# Patient Record
Sex: Female | Born: 1950 | Race: White | Hispanic: No | Smoking: Former smoker
Health system: Southern US, Community
[De-identification: ages and names within clinical notes are randomized; demographics above are authoritative.]

## PROBLEM LIST (undated history)

## (undated) HISTORY — PX: REDUCTION MAMMAPLASTY: SUR839

## (undated) HISTORY — PX: AUGMENTATION MAMMAPLASTY: SUR837

---

## 1998-08-09 ENCOUNTER — Ambulatory Visit (HOSPITAL_COMMUNITY): Admission: RE | Admit: 1998-08-09 | Discharge: 1998-08-09 | Payer: Self-pay | Admitting: Psychiatry

## 1998-08-16 ENCOUNTER — Other Ambulatory Visit: Admission: RE | Admit: 1998-08-16 | Discharge: 1998-08-16 | Payer: Self-pay | Admitting: Obstetrics and Gynecology

## 1999-07-30 ENCOUNTER — Encounter: Admission: RE | Admit: 1999-07-30 | Discharge: 1999-07-30 | Payer: Self-pay | Admitting: Obstetrics and Gynecology

## 1999-07-30 ENCOUNTER — Encounter: Payer: Self-pay | Admitting: Obstetrics and Gynecology

## 1999-09-18 ENCOUNTER — Other Ambulatory Visit: Admission: RE | Admit: 1999-09-18 | Discharge: 1999-09-18 | Payer: Self-pay | Admitting: Obstetrics and Gynecology

## 2000-09-15 ENCOUNTER — Encounter: Admission: RE | Admit: 2000-09-15 | Discharge: 2000-09-15 | Payer: Self-pay | Admitting: Family Medicine

## 2000-09-15 ENCOUNTER — Encounter: Payer: Self-pay | Admitting: Family Medicine

## 2001-10-21 ENCOUNTER — Encounter: Admission: RE | Admit: 2001-10-21 | Discharge: 2001-10-21 | Payer: Self-pay | Admitting: Obstetrics and Gynecology

## 2001-10-21 ENCOUNTER — Encounter: Payer: Self-pay | Admitting: Obstetrics and Gynecology

## 2002-12-13 ENCOUNTER — Encounter: Payer: Self-pay | Admitting: Obstetrics and Gynecology

## 2002-12-13 ENCOUNTER — Encounter: Admission: RE | Admit: 2002-12-13 | Discharge: 2002-12-13 | Payer: Self-pay | Admitting: Obstetrics and Gynecology

## 2003-12-27 ENCOUNTER — Encounter: Admission: RE | Admit: 2003-12-27 | Discharge: 2003-12-27 | Payer: Self-pay | Admitting: Obstetrics and Gynecology

## 2005-05-19 ENCOUNTER — Other Ambulatory Visit: Admission: RE | Admit: 2005-05-19 | Discharge: 2005-05-19 | Payer: Self-pay | Admitting: Family Medicine

## 2005-12-09 ENCOUNTER — Encounter: Admission: RE | Admit: 2005-12-09 | Discharge: 2005-12-09 | Payer: Self-pay | Admitting: Family Medicine

## 2006-01-22 ENCOUNTER — Encounter: Admission: RE | Admit: 2006-01-22 | Discharge: 2006-01-22 | Payer: Self-pay | Admitting: Family Medicine

## 2006-10-06 ENCOUNTER — Ambulatory Visit (HOSPITAL_COMMUNITY): Admission: RE | Admit: 2006-10-06 | Discharge: 2006-10-06 | Payer: Self-pay | Admitting: Family Medicine

## 2007-07-08 ENCOUNTER — Encounter: Admission: RE | Admit: 2007-07-08 | Discharge: 2007-07-08 | Payer: Self-pay | Admitting: Family Medicine

## 2008-11-02 ENCOUNTER — Encounter: Admission: RE | Admit: 2008-11-02 | Discharge: 2008-11-02 | Payer: Self-pay | Admitting: Family Medicine

## 2008-11-03 ENCOUNTER — Encounter: Admission: RE | Admit: 2008-11-03 | Discharge: 2008-11-03 | Payer: Self-pay | Admitting: Family Medicine

## 2009-01-17 ENCOUNTER — Other Ambulatory Visit: Admission: RE | Admit: 2009-01-17 | Discharge: 2009-01-17 | Payer: Self-pay | Admitting: Family Medicine

## 2009-02-28 ENCOUNTER — Ambulatory Visit: Payer: Self-pay | Admitting: Internal Medicine

## 2009-03-08 ENCOUNTER — Ambulatory Visit: Payer: Self-pay | Admitting: Internal Medicine

## 2010-04-09 ENCOUNTER — Encounter: Admission: RE | Admit: 2010-04-09 | Discharge: 2010-04-09 | Payer: Self-pay | Admitting: Family Medicine

## 2010-07-14 ENCOUNTER — Encounter: Payer: Self-pay | Admitting: Radiology

## 2010-07-14 ENCOUNTER — Encounter: Payer: Self-pay | Admitting: Family Medicine

## 2010-07-15 ENCOUNTER — Encounter: Payer: Self-pay | Admitting: Family Medicine

## 2011-07-15 ENCOUNTER — Other Ambulatory Visit: Payer: Self-pay | Admitting: Family Medicine

## 2011-07-15 DIAGNOSIS — Z1231 Encounter for screening mammogram for malignant neoplasm of breast: Secondary | ICD-10-CM

## 2011-07-25 ENCOUNTER — Ambulatory Visit
Admission: RE | Admit: 2011-07-25 | Discharge: 2011-07-25 | Disposition: A | Discharge status: Home / Self Care | Payer: Self-pay | Source: Ambulatory Visit | Attending: Family Medicine | Admitting: Family Medicine

## 2011-07-25 DIAGNOSIS — Z1231 Encounter for screening mammogram for malignant neoplasm of breast: Secondary | ICD-10-CM

## 2012-09-14 ENCOUNTER — Other Ambulatory Visit: Payer: Self-pay

## 2012-09-14 ENCOUNTER — Other Ambulatory Visit: Payer: Self-pay | Admitting: Obstetrics and Gynecology

## 2012-09-14 DIAGNOSIS — Z1231 Encounter for screening mammogram for malignant neoplasm of breast: Secondary | ICD-10-CM

## 2012-11-19 ENCOUNTER — Inpatient Hospital Stay: Admission: RE | Admit: 2012-11-19 | Payer: 59 | Source: Ambulatory Visit

## 2013-02-16 ENCOUNTER — Ambulatory Visit
Admission: RE | Admit: 2013-02-16 | Discharge: 2013-02-16 | Disposition: A | Discharge status: Home / Self Care | Payer: 59 | Source: Ambulatory Visit

## 2013-02-16 DIAGNOSIS — Z1231 Encounter for screening mammogram for malignant neoplasm of breast: Secondary | ICD-10-CM

## 2014-02-28 ENCOUNTER — Telehealth (HOSPITAL_COMMUNITY): Payer: Self-pay | Admitting: Family Medicine

## 2014-02-28 MED ORDER — METAXALONE 800 MG PO TABS: 800.0000 mg | ORAL_TABLET | Freq: Three times a day (TID) | ORAL | Status: DC

## 2014-02-28 NOTE — ED Notes (Signed)
Kimberly Davenport is planning on going on a horse riding trip in 2 weeks. She typically takes skelaxin for muscle spasms that are frequently associated with respect rating for the patient. She would like a refill of her Skelaxin if possible. She notes this is quite helpful. Will dispense Skelaxin 800 mg 3 times a day when necessary dispense #30  Gregor Hams, MD 02/28/14 [phone]

## 2014-06-19 ENCOUNTER — Other Ambulatory Visit: Payer: Self-pay

## 2014-06-19 DIAGNOSIS — Z1231 Encounter for screening mammogram for malignant neoplasm of breast: Secondary | ICD-10-CM

## 2014-06-29 ENCOUNTER — Ambulatory Visit
Admission: RE | Admit: 2014-06-29 | Discharge: 2014-06-29 | Disposition: A | Discharge status: Home / Self Care | Payer: 59 | Source: Ambulatory Visit

## 2014-06-29 DIAGNOSIS — Z1231 Encounter for screening mammogram for malignant neoplasm of breast: Secondary | ICD-10-CM

## 2014-11-17 ENCOUNTER — Telehealth (HOSPITAL_COMMUNITY): Payer: Self-pay | Admitting: Emergency Medicine

## 2014-11-17 NOTE — ED Notes (Signed)
Patient with 1 week of pelvic pain and swelling.  No discharge.  No fever or vomiting.  Melony Overly, MD 11/17/14 1248

## 2014-11-22 ENCOUNTER — Ambulatory Visit
Admission: RE | Admit: 2014-11-22 | Discharge: 2014-11-22 | Disposition: A | Discharge status: Home / Self Care | Payer: 59 | Source: Ambulatory Visit | Attending: Emergency Medicine | Admitting: Emergency Medicine

## 2014-11-22 MED ORDER — IOHEXOL 300 MG/ML  SOLN
100.0000 mL | Freq: Once | INTRAMUSCULAR | Status: AC | PRN
  Administered 2014-11-22: 100 mL via INTRAVENOUS

## 2015-01-04 ENCOUNTER — Other Ambulatory Visit: Payer: Self-pay | Admitting: Obstetrics and Gynecology

## 2015-01-04 DIAGNOSIS — N951 Menopausal and female climacteric states: Secondary | ICD-10-CM

## 2015-02-19 ENCOUNTER — Ambulatory Visit (INDEPENDENT_AMBULATORY_CARE_PROVIDER_SITE_OTHER): Payer: 59

## 2015-02-19 ENCOUNTER — Ambulatory Visit (INDEPENDENT_AMBULATORY_CARE_PROVIDER_SITE_OTHER): Payer: 59 | Admitting: Podiatry

## 2015-02-19 VITALS — BP 145/86 | HR 76 | Resp 16

## 2015-02-19 DIAGNOSIS — D3610 Benign neoplasm of peripheral nerves and autonomic nervous system, unspecified: Secondary | ICD-10-CM

## 2015-02-19 DIAGNOSIS — G5761 Lesion of plantar nerve, right lower limb: Secondary | ICD-10-CM | POA: Diagnosis not present

## 2015-02-19 NOTE — Progress Notes (Signed)
   Subjective:    Patient ID: Kimberly Davenport, female    DOB: 12-Oct-1950, 64 y.o.   MRN: [id number]  HPI Pt presents with sharp shooting pain right foot 2nd and 3rd toe, ongoing for several months and worsening   Review of Systems  All other systems reviewed and are negative.      Objective:   Physical Exam        Assessment & Plan:

## 2015-02-21 NOTE — Progress Notes (Signed)
Subjective:     Patient ID: Kimberly Davenport, female   DOB: 01-03-1951, 64 y.o.   MRN: [id number]  HPI patient presents stating I have a lot of pain in my right foot with shooting-like discomfort which is been going on for a number of months and getting worse. Also have a lot of problems with lower back pain   Review of Systems  All other systems reviewed and are negative.      Objective:   Physical Exam  Constitutional: She is oriented to person, place, and time.  Cardiovascular: Intact distal pulses.   Musculoskeletal: Normal range of motion.  Neurological: She is oriented to person, place, and time.  Skin: Skin is warm.  Nursing note and vitals reviewed.  neurovascular status found to be intact with muscle strength adequate and range of motion of the subtalar midtarsal joint within normal limits. Patient's noted to have good digital perfusion is well oriented 3 and is found to have pain to palpation third over second interspace right foot with radiating discomfort into the adjacent digits. 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 Today I went ahead and I did a neuro lysis injection third interspace right foot which patient had trouble tolerating and discussed that I want to see the results of this and decide best long-term treatment but it may require excision. She will pay attention today to how it feels and will wear shoes it generally compress and cause problems with her foot. Reappoint to recheck again in 2 weeks

## 2015-03-05 ENCOUNTER — Ambulatory Visit: Payer: 59 | Admitting: Podiatry

## 2015-07-04 ENCOUNTER — Ambulatory Visit (INDEPENDENT_AMBULATORY_CARE_PROVIDER_SITE_OTHER): Payer: 59 | Admitting: Podiatry

## 2015-07-04 ENCOUNTER — Encounter: Payer: Self-pay | Admitting: Podiatry

## 2015-07-04 VITALS — BP 145/84 | HR 69 | Resp 16

## 2015-07-04 DIAGNOSIS — D3610 Benign neoplasm of peripheral nerves and autonomic nervous system, unspecified: Secondary | ICD-10-CM

## 2015-07-04 MED FILL — clonazePAM 1 MG TABS: 1 | 30 days supply | Qty: 60 | Fill #2

## 2015-07-04 NOTE — Patient Instructions (Signed)
Pre-Operative Instructions  Congratulations, you have decided to take an important step to improving your quality of life.  You can be assured that the doctors of [facility] will be with you every step of the way.  1. Plan to be at the surgery center/hospital at least 1 (one) hour prior to your scheduled time unless otherwise directed by the surgical center/hospital staff.  You must have a responsible adult accompany you, remain during the surgery and drive you home.  Make sure you have directions to the surgical center/hospital and know how to get there on time. 2. For hospital based surgery you will need to obtain a history and physical form from your family physician within 1 month prior to the date of surgery- we will give you a form for you primary physician.  3. We make every effort to accommodate the date you request for surgery.  There are however, times where surgery dates or times have to be moved.  We will contact you as soon as possible if a change in schedule is required.   4. No Aspirin/Ibuprofen for one week before surgery.  If you are on aspirin, any non-steroidal anti-inflammatory medications (Mobic, Aleve, Ibuprofen) you should stop taking it 7 days prior to your surgery.  You make take Tylenol  For pain prior to surgery.  5. Medications- If you are taking daily heart and blood pressure medications, seizure, reflux, allergy, asthma, anxiety, pain or diabetes medications, make sure the surgery center/hospital is aware before the day of surgery so they may notify you which medications to take or avoid the day of surgery. 6. No food or drink after midnight the night before surgery unless directed otherwise by surgical center/hospital staff. 7. No alcoholic beverages 24 hours prior to surgery.  No smoking 24 hours prior to or 24 hours after surgery. 8. Wear loose pants or shorts- loose enough to fit over bandages, boots, and casts. 9. No slip on shoes, sneakers are best. 10. Bring  your boot with you to the surgery center/hospital.  Also bring crutches or a walker if your physician has prescribed it for you.  If you do not have this equipment, it will be provided for you after surgery. 11. If you have not been contracted by the surgery center/hospital by the day before your surgery, call to confirm the date and time of your surgery. 12. Leave-time from work may vary depending on the type of surgery you have.  Appropriate arrangements should be made prior to surgery with your employer. 13. Prescriptions will be provided immediately following surgery by your doctor.  Have these filled as soon as possible after surgery and take the medication as directed. 14. Remove nail polish on the operative foot. 15. Wash the night before surgery.  The night before surgery wash the foot and leg well with the antibacterial soap provided and water paying special attention to beneath the toenails and in between the toes.  Rinse thoroughly with water and dry well with a towel.  Perform this wash unless told not to do so by your physician.  Enclosed: 1 Ice pack (please put in freezer the night before surgery)   1 Hibiclens skin cleaner   Pre-op Instructions  If you have any questions regarding the instructions, do not hesitate to call our office.  [place]: [address] [place], [place] [address] [phone]  [place]: [address] [place], [place] [address] [phone]  [place]: [address]  [place], [place] [address] [phone]  Dr. Richard   Tuchman DPM, Dr. Norman Regal DPM Dr. Richard Sikora DPM, Dr. M. Todd Hyatt DPM, Dr. Kathryn Egerton DPM 

## 2015-07-06 NOTE — Progress Notes (Signed)
Subjective:     Patient ID: Kimberly Davenport, female   DOB: Apr 21, 1951, 65 y.o.   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 Due to long-standing nature patient has opted for surgical intervention in this case and I have recommended utilization of neurectomy period I allowed her to read consent form going over alternative treatments complications and patient wants surgery understanding risk and the fact this is a clinical diagnosis with no guarantee of success. Understands recovery can take upwards of 6 months and she wants surgery signed consent form is given all preoperative instructions and is scheduled for outpatient surgery and is encouraged to call with any questions prior to procedure

## 2015-07-17 ENCOUNTER — Encounter: Payer: Self-pay | Admitting: Podiatry

## 2015-07-17 DIAGNOSIS — Z01818 Encounter for other preprocedural examination: Secondary | ICD-10-CM | POA: Diagnosis not present

## 2015-07-17 DIAGNOSIS — G5761 Lesion of plantar nerve, right lower limb: Secondary | ICD-10-CM | POA: Diagnosis not present

## 2015-07-17 MED FILL — HYDROCODON-APAP 10-325: 10-325 | 4 days supply | Qty: 20 | Fill #0

## 2015-07-23 ENCOUNTER — Ambulatory Visit (INDEPENDENT_AMBULATORY_CARE_PROVIDER_SITE_OTHER): Payer: 59 | Admitting: Podiatry

## 2015-07-23 DIAGNOSIS — Z9889 Other specified postprocedural states: Secondary | ICD-10-CM | POA: Diagnosis not present

## 2015-07-23 DIAGNOSIS — D3610 Benign neoplasm of peripheral nerves and autonomic nervous system, unspecified: Secondary | ICD-10-CM

## 2015-07-26 ENCOUNTER — Other Ambulatory Visit: Payer: Self-pay

## 2015-07-30 ENCOUNTER — Telehealth: Payer: Self-pay | Admitting: *Deleted

## 2015-07-30 MED ORDER — CEPHALEXIN 500 MG PO CAPS: 500.0000 mg | ORAL_CAPSULE | Freq: Three times a day (TID) | ORAL | Status: DC

## 2015-07-30 MED FILL — CEPHALEXIN 500 MG CAPSULE: 500 | 10 days supply | Qty: 30 | Fill #0

## 2015-07-30 NOTE — Telephone Encounter (Addendum)
Pt states she had neuroma surgery 2 weeks ago, and has questions.  I spoke with pt and she said Dr. Paulla Dolly had said she would not have sutures, but she sees something at the site, and wanted me to talk with her husband, Dr. Juventino Slovak.  Dr. Juventino Slovak states the surgery site looks healed, but there is an area of redness around what looks like a subcutaneous suture.  I told Dr. Juventino Slovak that on occasion a dissolving suture may become exposed to the surface and dry, rather than dissolve and there may be a suture reaction - redness around the area.  I told Dr. Juventino Slovak he could gently tease the suture out and put small amount of Neosporin on the area and a bandaid for a day or two, or could come in to see me.  Dr. Juventino Slovak states he'll discuss it with his wife. 07/30/2015-Pt presents for me to check the right forefoot for post op suture remaining. Pt's right 3rd dorsal interspace suture site is red about 1 inch width to each side of the suture line, swollen and hot.  Pt states it feels better after the epsom salt soak.  Dr. Jacqualyn Posey states continue the soaks and lightly cover area with Neosporin dressing, and ordered Keflex 500mg  #30 1 tid and see Dr. Paulla Dolly in 2 weeks.  I painted the suture line with betadine and removed the proximal dried suture string, but the interdigital suture area was too painful to remove the string and knot.

## 2015-08-06 DIAGNOSIS — D225 Melanocytic nevi of trunk: Secondary | ICD-10-CM | POA: Diagnosis not present

## 2015-08-06 DIAGNOSIS — L570 Actinic keratosis: Secondary | ICD-10-CM | POA: Diagnosis not present

## 2015-08-06 DIAGNOSIS — D1801 Hemangioma of skin and subcutaneous tissue: Secondary | ICD-10-CM | POA: Diagnosis not present

## 2015-08-06 DIAGNOSIS — L821 Other seborrheic keratosis: Secondary | ICD-10-CM | POA: Diagnosis not present

## 2015-08-13 ENCOUNTER — Other Ambulatory Visit: Payer: 59

## 2015-08-16 ENCOUNTER — Other Ambulatory Visit: Payer: 59

## 2015-08-17 ENCOUNTER — Ambulatory Visit (INDEPENDENT_AMBULATORY_CARE_PROVIDER_SITE_OTHER): Payer: 59 | Admitting: Podiatry

## 2015-08-17 ENCOUNTER — Encounter: Payer: Self-pay | Admitting: Podiatry

## 2015-08-17 DIAGNOSIS — Z9889 Other specified postprocedural states: Secondary | ICD-10-CM

## 2015-08-17 DIAGNOSIS — D3610 Benign neoplasm of peripheral nerves and autonomic nervous system, unspecified: Secondary | ICD-10-CM

## 2015-08-17 MED ORDER — MELOXICAM 15 MG PO TABS: 15.0000 mg | ORAL_TABLET | Freq: Every day | ORAL | Status: DC

## 2015-08-17 MED FILL — MELOXICAM 15 MG TABLET: 15 | 30 days supply | Qty: 30 | Fill #0

## 2015-08-19 NOTE — Progress Notes (Signed)
Subjective:     Patient ID: Kimberly Davenport, female   DOB: [birth year], 65 y.o.   MRN: [id number]  HPI patient is concerned because she still having discomfort in her right foot after having neuroma excision 4 weeks ago   Review of Systems     Objective:   Physical Exam Neurovascular status intact and wound edges are well coapted. I did not note any abnormal findings currently except for the pain patient is experiencing which is diffuse in nature and also she states can shoot into her ankles at times. It is mild swelling around the area which is normal for this. Postop    Assessment:     Patient is very early in the healing process from neuroma but may have a moderate inflammatory complex    Plan:     Educated patient on this and at this time placed patient on mobile big 15 mg daily and soak therapy. We will see again in 4 weeks or earlier if any problems should occur

## 2015-08-20 ENCOUNTER — Other Ambulatory Visit: Payer: 59

## 2015-08-23 ENCOUNTER — Encounter: Payer: Self-pay | Admitting: Podiatry

## 2015-08-29 ENCOUNTER — Telehealth: Payer: Self-pay | Admitting: *Deleted

## 2015-08-29 MED FILL — clonazePAM 1 MG TABS: 1 | 30 days supply | Qty: 60 | Fill #3

## 2015-08-29 NOTE — Telephone Encounter (Signed)
Entered in error

## 2015-09-07 ENCOUNTER — Encounter: Payer: Self-pay | Admitting: Internal Medicine

## 2015-09-11 NOTE — Progress Notes (Signed)
Patient ID: SIOBHAIN [place]LAND, female   DOB: 1951-02-08, 65 y.o.   MRN: [id number] Dr. Paulla Dolly performed an excision, soft tissue mass 3rd innerspace (right) foot on 01/24/2017at the [facility].

## 2015-09-20 MED FILL — MELOXICAM 15 MG TABLET: 15 | 30 days supply | Qty: 30 | Fill #1

## 2015-10-01 ENCOUNTER — Encounter: Payer: 59 | Admitting: *Deleted

## 2015-10-11 ENCOUNTER — Ambulatory Visit: Payer: 59 | Admitting: Podiatry

## 2015-11-05 MED FILL — clonazePAM 1 MG TABS: 1 | 30 days supply | Qty: 60 | Fill #0

## 2016-01-02 MED FILL — clonazePAM 1 MG TABS: 1 | 30 days supply | Qty: 60 | Fill #1

## 2016-01-31 ENCOUNTER — Other Ambulatory Visit: Payer: Self-pay | Admitting: Obstetrics and Gynecology

## 2016-01-31 DIAGNOSIS — Z9882 Breast implant status: Secondary | ICD-10-CM

## 2016-01-31 DIAGNOSIS — N951 Menopausal and female climacteric states: Secondary | ICD-10-CM

## 2016-01-31 DIAGNOSIS — Z1231 Encounter for screening mammogram for malignant neoplasm of breast: Secondary | ICD-10-CM

## 2016-02-13 ENCOUNTER — Other Ambulatory Visit: Payer: Self-pay | Admitting: Obstetrics and Gynecology

## 2016-02-13 DIAGNOSIS — E2839 Other primary ovarian failure: Secondary | ICD-10-CM

## 2016-02-21 MED FILL — clonazePAM 1 MG TABS: 1 | 30 days supply | Qty: 60 | Fill #2

## 2016-02-27 MED FILL — METAXALONE 800 MG TABLET: 800 | 10 days supply | Qty: 30 | Fill #0

## 2016-03-17 ENCOUNTER — Ambulatory Visit
Admission: RE | Admit: 2016-03-17 | Discharge: 2016-03-17 | Disposition: A | Discharge status: Home / Self Care | Payer: 59 | Source: Ambulatory Visit | Attending: Obstetrics and Gynecology | Admitting: Obstetrics and Gynecology

## 2016-03-17 DIAGNOSIS — M85851 Other specified disorders of bone density and structure, right thigh: Secondary | ICD-10-CM | POA: Diagnosis not present

## 2016-03-17 DIAGNOSIS — Z1231 Encounter for screening mammogram for malignant neoplasm of breast: Secondary | ICD-10-CM | POA: Diagnosis not present

## 2016-03-17 DIAGNOSIS — E2839 Other primary ovarian failure: Secondary | ICD-10-CM

## 2016-03-17 DIAGNOSIS — Z9882 Breast implant status: Secondary | ICD-10-CM

## 2016-03-17 DIAGNOSIS — Z780 Asymptomatic menopausal state: Secondary | ICD-10-CM | POA: Diagnosis not present

## 2016-03-21 NOTE — Progress Notes (Signed)
Subjective:     Patient ID: Kimberly Davenport, female   DOB: 1951/04/05, 65 y.o.   MRN: [id number]  HPI patient states I seem to be doing okay with minimal pain or swelling   Review of Systems     Objective:   Physical Exam Neurovascular status intact with well-healed surgical site right with wound edges well coapted and minimal discomfort    Assessment:     Doing well post neuroma excision    Plan:     Advised on physical therapy and continued immobilization compression elevation and reappoint to recheck again in approximate 6 weeks or earlier if needed

## 2016-04-08 DIAGNOSIS — D3613 Benign neoplasm of peripheral nerves and autonomic nervous system of lower limb, including hip: Secondary | ICD-10-CM | POA: Diagnosis not present

## 2016-04-08 DIAGNOSIS — Z1389 Encounter for screening for other disorder: Secondary | ICD-10-CM | POA: Diagnosis not present

## 2016-04-08 DIAGNOSIS — D1803 Hemangioma of intra-abdominal structures: Secondary | ICD-10-CM | POA: Diagnosis not present

## 2016-04-08 DIAGNOSIS — N951 Menopausal and female climacteric states: Secondary | ICD-10-CM | POA: Diagnosis not present

## 2016-04-08 DIAGNOSIS — M859 Disorder of bone density and structure, unspecified: Secondary | ICD-10-CM | POA: Diagnosis not present

## 2016-04-08 DIAGNOSIS — K635 Polyp of colon: Secondary | ICD-10-CM | POA: Diagnosis not present

## 2016-04-08 DIAGNOSIS — M545 Low back pain: Secondary | ICD-10-CM | POA: Diagnosis not present

## 2016-04-08 DIAGNOSIS — J302 Other seasonal allergic rhinitis: Secondary | ICD-10-CM | POA: Diagnosis not present

## 2016-04-08 DIAGNOSIS — K5909 Other constipation: Secondary | ICD-10-CM | POA: Diagnosis not present

## 2016-04-15 MED FILL — clonazePAM 1 MG TABS: 1 | 30 days supply | Qty: 60 | Fill #0

## 2016-05-19 DIAGNOSIS — H2513 Age-related nuclear cataract, bilateral: Secondary | ICD-10-CM | POA: Diagnosis not present

## 2016-05-19 DIAGNOSIS — H524 Presbyopia: Secondary | ICD-10-CM | POA: Diagnosis not present

## 2016-05-19 DIAGNOSIS — H5203 Hypermetropia, bilateral: Secondary | ICD-10-CM | POA: Diagnosis not present

## 2016-05-19 DIAGNOSIS — H18413 Arcus senilis, bilateral: Secondary | ICD-10-CM | POA: Diagnosis not present

## 2016-05-27 MED FILL — CEPHALEXIN 500 MG CAPSULE: 500 | 5 days supply | Qty: 20 | Fill #0

## 2016-06-26 MED FILL — clonazePAM 1 MG TABS: 1 | 30 days supply | Qty: 60 | Fill #1

## 2016-08-28 MED FILL — valACYclovir HCL 1 GM TABS: 1 | 1 days supply | Qty: 4 | Fill #0

## 2016-08-28 MED FILL — clonazePAM 1 MG TABS: 1 | 30 days supply | Qty: 60 | Fill #2

## 2016-08-29 MED FILL — ALPRAZolam 0.25 MG TABS: 0.25 | 15 days supply | Qty: 30 | Fill #0

## 2016-09-08 MED FILL — valACYclovir HCL 1 GM TABS: 1 | 10 days supply | Qty: 20 | Fill #0

## 2016-10-09 MED FILL — ALPRAZolam 0.25 MG TABS: 0.25 | 15 days supply | Qty: 30 | Fill #0

## 2016-11-13 MED FILL — AMLODIPINE BESYLATE 2.5 MG: 2.5 | 30 days supply | Qty: 30 | Fill #0

## 2016-11-13 MED FILL — ALPRAZolam 0.25 MG TABS: 0.25 | 15 days supply | Qty: 30 | Fill #0

## 2016-11-27 DIAGNOSIS — Z6823 Body mass index (BMI) 23.0-23.9, adult: Secondary | ICD-10-CM | POA: Diagnosis not present

## 2016-11-27 DIAGNOSIS — F419 Anxiety disorder, unspecified: Secondary | ICD-10-CM | POA: Diagnosis not present

## 2016-11-27 DIAGNOSIS — I10 Essential (primary) hypertension: Secondary | ICD-10-CM | POA: Diagnosis not present

## 2016-11-27 MED FILL — AMLODIPINE BESYLATE 5 MG TA: 5 | 90 days supply | Qty: 90 | Fill #0

## 2016-11-27 MED FILL — ESCITALOPRAM 10 MG TABLET: 10 | 30 days supply | Qty: 30 | Fill #0

## 2016-12-01 MED FILL — ALPRAZolam 0.25 MG TABS: 0.25 | 15 days supply | Qty: 30 | Fill #1

## 2016-12-12 MED FILL — ALPRAZolam 0.25 MG TABS: 0.25 | 15 days supply | Qty: 30 | Fill #2

## 2016-12-17 MED FILL — clonazePAM 1 MG TABS: 1 | 30 days supply | Qty: 60 | Fill #0

## 2016-12-31 MED FILL — ESCITALOPRAM 10 MG TABLET: 10 | 30 days supply | Qty: 30 | Fill #1

## 2017-01-22 MED FILL — ALPRAZolam 0.25 MG TABS: 0.25 | 15 days supply | Qty: 30 | Fill #3

## 2017-02-02 MED FILL — ESCITALOPRAM 10 MG TABLET: 10 | 30 days supply | Qty: 30 | Fill #2

## 2017-02-26 DIAGNOSIS — F419 Anxiety disorder, unspecified: Secondary | ICD-10-CM | POA: Diagnosis not present

## 2017-02-26 DIAGNOSIS — Z6823 Body mass index (BMI) 23.0-23.9, adult: Secondary | ICD-10-CM | POA: Diagnosis not present

## 2017-02-26 DIAGNOSIS — I10 Essential (primary) hypertension: Secondary | ICD-10-CM | POA: Diagnosis not present

## 2017-02-26 MED FILL — ESCITALOPRAM 20 MG TABLET: 20 | 30 days supply | Qty: 30 | Fill #0

## 2017-02-26 MED FILL — AMLODIPINE BESYLATE 10 MG T: 10 | 30 days supply | Qty: 30 | Fill #0

## 2017-03-11 MED FILL — ALPRAZolam 0.25 MG TABS: 0.25 | 15 days supply | Qty: 30 | Fill #0

## 2017-03-12 MED FILL — clonazePAM 1 MG TABS: 1 | 30 days supply | Qty: 60 | Fill #1

## 2017-03-27 DIAGNOSIS — Z23 Encounter for immunization: Secondary | ICD-10-CM | POA: Diagnosis not present

## 2017-04-03 MED FILL — AMLODIPINE BESYLATE 10 MG T: 10 | 90 days supply | Qty: 90 | Fill #1

## 2017-04-03 MED FILL — ALPRAZolam 0.25 MG TABS: 0.25 | 15 days supply | Qty: 30 | Fill #1

## 2017-04-03 MED FILL — ESCITALOPRAM 20 MG TABLET: 20 | 30 days supply | Qty: 30 | Fill #1

## 2017-04-22 ENCOUNTER — Ambulatory Visit (INDEPENDENT_AMBULATORY_CARE_PROVIDER_SITE_OTHER): Payer: 59

## 2017-04-22 ENCOUNTER — Ambulatory Visit (INDEPENDENT_AMBULATORY_CARE_PROVIDER_SITE_OTHER): Payer: 59 | Admitting: Orthopaedic Surgery

## 2017-04-22 ENCOUNTER — Encounter (INDEPENDENT_AMBULATORY_CARE_PROVIDER_SITE_OTHER): Payer: Self-pay | Admitting: Orthopaedic Surgery

## 2017-04-22 DIAGNOSIS — S42032A Displaced fracture of lateral end of left clavicle, initial encounter for closed fracture: Secondary | ICD-10-CM | POA: Diagnosis not present

## 2017-04-22 DIAGNOSIS — M25512 Pain in left shoulder: Secondary | ICD-10-CM

## 2017-04-22 MED FILL — HYDROCODON-APAP 5-325: 5-325 | 5 days supply | Qty: 20 | Fill #0

## 2017-04-22 NOTE — Progress Notes (Signed)
Office Visit Note   Patient: Kimberly Davenport           Date of Birth: May 24, 1951           MRN: [id number] Visit Date: 04/22/2017              Requested by: Fanny Bien, [address] [place], [place] [address] PCP: Fanny Bien, MD   Assessment & Plan: Visit Diagnoses:  1. Acute pain of left shoulder   2. Displaced fracture of lateral end of left clavicle, initial encounter for closed fracture   3. Closed displaced fracture of acromial end of left clavicle, initial encounter     Plan: Placed in a sling.  Norco given for pain.  She will not take it if he takes Xanax.  Take the pain medication when she drinks.  We will follow her up in 3 weeks.  We discussed operative versus non-treatment and she would like to proceed with nonoperative treatment.  X-rays were reviewed.  Follow-Up Instructions: Return in about 3 weeks (around 05/13/2017).   Orders:  Orders Placed This Encounter  Procedures  . XR Shoulder Left   No orders of the defined types were placed in this encounter.     Procedures: No procedures performed   Clinical Data: No additional findings.   Subjective: Chief Complaint  Patient presents with  . 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  She had a prescription for Xanax took somewhat to sleep and got up sometime during the night must have fallen.  She woke up with pain in her left shoulder has a black eye.  Bruising over the [facility] joint tenderness left.  There is no numbness or tingling in her hands.  No past injury to her clavicle.  She has pain with outstretched reaching.  The patient states she thinks she broke her clavicle.  Patient is here with her sister.  Review of Systems history of back pain, epidural 2012.  Neuroma excision foot.  Previously worked as a Armed forces technical officer.  For stroke, seizures heart problems.   Objective: Vital Signs: There were no vitals  taken for this visit.  Physical Exam  Constitutional: She is oriented to person, place, and time. She appears well-developed.  HENT:  Head: Normocephalic.  Right Ear: External ear normal.  Left Ear: External ear normal.  Eyes: Pupils are equal, round, and reactive to light.  Left periorbital ecchymosis.  No tenderness to palpation the zygoma is normal.  Neck: No tracheal deviation present. No thyromegaly present.  Cardiovascular: Normal rate.   Pulmonary/Chest: Effort normal.  Abdominal: Soft.  Neurological: She is alert and oriented to person, place, and time.  Skin: Skin is warm and dry.  Psychiatric: She has a normal mood and affect. Her behavior is normal.    Ortho Exam soft tissue swelling over the pain with inspiration.  No dyspnea.  Sensation of the hand thenar or hyperthenar motor is normal.  Median ulnar sensation.  Good cervical range of motion flexion-extension no tenderness.  Mild scoliosis lumbar region.  Normal heel toe gait.  Specialty Comments:  No specialty comments available.  Imaging: No results found.   PMFS History: Patient Active Problem List   Diagnosis Date Noted  . Displaced fracture of lateral end of left clavicle, initial encounter for closed fracture 04/22/2017   No past medical history on file.  No family history on file.  No past surgical history on file. Social History   Occupational History  . Not on file.   Social History Main Topics  . Smoking status: Former Research scientist (life sciences)  . Smokeless tobacco: Never Used  . Alcohol use Not on file  . Drug use: Unknown  . Sexual activity: Not on file

## 2017-05-12 ENCOUNTER — Ambulatory Visit (INDEPENDENT_AMBULATORY_CARE_PROVIDER_SITE_OTHER): Payer: 59

## 2017-05-12 ENCOUNTER — Ambulatory Visit (INDEPENDENT_AMBULATORY_CARE_PROVIDER_SITE_OTHER): Payer: 59 | Admitting: Orthopaedic Surgery

## 2017-05-12 ENCOUNTER — Encounter (INDEPENDENT_AMBULATORY_CARE_PROVIDER_SITE_OTHER): Payer: Self-pay | Admitting: Orthopaedic Surgery

## 2017-05-12 VITALS — BP 157/85 | HR 69

## 2017-05-12 DIAGNOSIS — S42032D Displaced fracture of lateral end of left clavicle, subsequent encounter for fracture with routine healing: Secondary | ICD-10-CM | POA: Diagnosis not present

## 2017-05-12 NOTE — Progress Notes (Signed)
   Post-Op Visit Note   Patient: Kimberly Davenport           Date of Birth: 1950/11/01           MRN: [id number] Visit Date: 05/12/2017 PCP: Shon Baton, MD   Assessment & Plan: Follow-up distal clavicle fracture, left after a fall in the house.  Chief Complaint:  Chief Complaint  Patient presents with  . Left Shoulder - Fracture, Follow-up  . Neck - Pain   Visit Diagnoses:  1. Closed displaced fracture of acromial end of left clavicle with routine healing, subsequent encounter     Plan: There is no further displacement and x-rays she can gradually increase her activity slowly as tolerated.  She can leave her sling off when she sleeps if she so desires.  Swelling and ecchymosis have decreased.  Follow-Up Instructions: Return if symptoms worsen or fail to improve.   Orders:  Orders Placed This Encounter  Procedures  . XR Clavicle Left   No orders of the defined types were placed in this encounter.   Imaging: No results found.  PMFS History: Patient Active Problem List   Diagnosis Date Noted  . Displaced fracture of lateral end of left clavicle, initial encounter for closed fracture 04/22/2017   No past medical history on file.  No family history on file.  No past surgical history on file. Social History   Occupational History  . Not on file  Tobacco Use  . Smoking status: Former Research scientist (life sciences)  . Smokeless tobacco: Never Used  Substance and Sexual Activity  . Alcohol use: Not on file  . Drug use: Not on file  . Sexual activity: Not on file

## 2017-05-19 MED FILL — ESCITALOPRAM 20 MG TABLET: 20 | 30 days supply | Qty: 30 | Fill #2

## 2017-06-18 MED FILL — METAXALONE 800 MG TABLET: 800 | 14 days supply | Qty: 28 | Fill #0

## 2017-07-22 ENCOUNTER — Other Ambulatory Visit: Payer: Self-pay | Admitting: Obstetrics and Gynecology

## 2017-07-22 DIAGNOSIS — Z139 Encounter for screening, unspecified: Secondary | ICD-10-CM

## 2017-08-10 ENCOUNTER — Ambulatory Visit
Admission: RE | Admit: 2017-08-10 | Discharge: 2017-08-10 | Disposition: A | Discharge status: Home / Self Care | Payer: 59 | Source: Ambulatory Visit | Attending: Obstetrics and Gynecology | Admitting: Obstetrics and Gynecology

## 2017-08-10 DIAGNOSIS — Z139 Encounter for screening, unspecified: Secondary | ICD-10-CM

## 2017-09-09 DIAGNOSIS — M79601 Pain in right arm: Secondary | ICD-10-CM | POA: Diagnosis not present

## 2017-09-15 DIAGNOSIS — M79601 Pain in right arm: Secondary | ICD-10-CM | POA: Diagnosis not present

## 2017-09-18 DIAGNOSIS — M79601 Pain in right arm: Secondary | ICD-10-CM | POA: Diagnosis not present

## 2017-09-21 DIAGNOSIS — M79601 Pain in right arm: Secondary | ICD-10-CM | POA: Diagnosis not present

## 2017-09-23 MED FILL — clonazePAM 1 MG TABS: 1 | 23 days supply | Qty: 45 | Fill #0

## 2017-09-23 MED FILL — AMLODIPINE BESYLATE 10 MG T: 10 | 30 days supply | Qty: 30 | Fill #0

## 2017-09-29 DIAGNOSIS — M79601 Pain in right arm: Secondary | ICD-10-CM | POA: Diagnosis not present

## 2017-10-05 DIAGNOSIS — M79601 Pain in right arm: Secondary | ICD-10-CM | POA: Diagnosis not present

## 2017-10-07 DIAGNOSIS — M79601 Pain in right arm: Secondary | ICD-10-CM | POA: Diagnosis not present

## 2017-10-12 DIAGNOSIS — M79601 Pain in right arm: Secondary | ICD-10-CM | POA: Diagnosis not present

## 2017-10-14 DIAGNOSIS — M79601 Pain in right arm: Secondary | ICD-10-CM | POA: Diagnosis not present

## 2017-10-20 DIAGNOSIS — M79601 Pain in right arm: Secondary | ICD-10-CM | POA: Diagnosis not present

## 2017-10-22 DIAGNOSIS — M79601 Pain in right arm: Secondary | ICD-10-CM | POA: Diagnosis not present

## 2017-10-27 DIAGNOSIS — M79601 Pain in right arm: Secondary | ICD-10-CM | POA: Diagnosis not present

## 2017-10-29 DIAGNOSIS — M79601 Pain in right arm: Secondary | ICD-10-CM | POA: Diagnosis not present

## 2017-11-23 DIAGNOSIS — M7541 Impingement syndrome of right shoulder: Secondary | ICD-10-CM | POA: Diagnosis not present

## 2018-01-11 MED FILL — clonazePAM 1 MG TABS: 1 | 23 days supply | Qty: 45 | Fill #0

## 2018-01-13 MED FILL — AMLODIPINE BESYLATE 5 MG TA: 5 | 30 days supply | Qty: 30 | Fill #0

## 2018-02-23 MED FILL — AMLODIPINE BESYLATE 5 MG TA: 5 | 30 days supply | Qty: 30 | Fill #1

## 2018-03-15 MED FILL — ZOLPIDEM TARTRATE 5 MG TAB: 5 | 14 days supply | Qty: 14 | Fill #0

## 2018-03-16 MED FILL — clonazePAM 1 MG TABS: 1 | 23 days supply | Qty: 45 | Fill #0

## 2018-03-19 MED FILL — AMLODIPINE BESYLATE 5 MG TA: 5 | 30 days supply | Qty: 30 | Fill #2

## 2018-04-05 MED FILL — AMLODIPINE BESYLATE 5 MG TA: 5 | 30 days supply | Qty: 30 | Fill #2

## 2018-04-05 MED FILL — clonazePAM 1 MG TABS: 1 | 23 days supply | Qty: 45 | Fill #0

## 2018-07-07 ENCOUNTER — Other Ambulatory Visit: Payer: Self-pay | Admitting: Internal Medicine

## 2018-07-07 DIAGNOSIS — D1803 Hemangioma of intra-abdominal structures: Secondary | ICD-10-CM

## 2018-07-14 ENCOUNTER — Other Ambulatory Visit: Payer: Self-pay | Admitting: Obstetrics and Gynecology

## 2018-07-14 DIAGNOSIS — Z1231 Encounter for screening mammogram for malignant neoplasm of breast: Secondary | ICD-10-CM

## 2018-07-19 ENCOUNTER — Ambulatory Visit
Admission: RE | Admit: 2018-07-19 | Discharge: 2018-07-19 | Disposition: A | Discharge status: Home / Self Care | Payer: PPO | Source: Ambulatory Visit | Attending: Internal Medicine | Admitting: Internal Medicine

## 2018-07-19 DIAGNOSIS — D1803 Hemangioma of intra-abdominal structures: Secondary | ICD-10-CM

## 2018-07-19 DIAGNOSIS — K7689 Other specified diseases of liver: Secondary | ICD-10-CM | POA: Diagnosis not present

## 2018-07-20 ENCOUNTER — Encounter (INDEPENDENT_AMBULATORY_CARE_PROVIDER_SITE_OTHER): Payer: Self-pay | Admitting: Orthopaedic Surgery

## 2018-07-20 ENCOUNTER — Ambulatory Visit (INDEPENDENT_AMBULATORY_CARE_PROVIDER_SITE_OTHER): Payer: PPO | Admitting: Orthopaedic Surgery

## 2018-07-20 VITALS — BP 147/91 | HR 91 | Resp 12 | Ht 65.0 in | Wt 142.0 lb

## 2018-07-20 DIAGNOSIS — M7072 Other bursitis of hip, left hip: Secondary | ICD-10-CM

## 2018-07-20 NOTE — Progress Notes (Signed)
Office Visit Note   Patient: Kimberly Davenport           Date of Birth: 10/11/1950           MRN: [id number] Visit Date: 07/20/2018              Requested by: Shon Baton, [address] [place], [place] [address] PCP: Shon Baton, MD   Assessment & Plan: Visit Diagnoses:  1. Ischial bursitis of left side     Plan: Left buttock pain most likely hamstring pull in the area of the ischium..  This should resolve on its own over the next 3 to 4 weeks.  Discussed use of NSAIDs and avoidance of activity creating pain  Follow-Up Instructions: Return if symptoms worsen or fail to improve.   Orders:  No orders of the defined types were placed in this encounter.  No orders of the defined types were placed in this encounter.     Procedures: No procedures performed   Clinical Data: No additional findings.   Subjective: Chief Complaint  Patient presents with  . Spine - Pain  . Back Pain    left sided sciatica pain ., injuried 3 week ago bowling slip forwarded , difficult walking ,tried massage ,advil ,tylenol,  Approximately 3 weeks ago Kimberly Davenport was bowling.  Her left foot slipped causing her to hyperextend her left lower extremity with immediate onset of pain in the area of the left buttock.  She still having some discomfort with certain activities i.e. lunges oftentimes with ambulation.  The pain is localized in the area of the ischium.  Has a history of back pain but presently not experiencing any back pain.  No numbness or tingling.  No groin pain.  HPI  Review of Systems   Objective: Vital Signs: BP (!) 147/91   Pulse 91   Resp 12   Ht 5\' 5"  (1.651 m)   Wt 142 lb (64.4 kg)   BMI 23.63 kg/m   Physical Exam Constitutional:      Appearance: She is well-developed.  Eyes:     Pupils: Pupils are equal, round, and reactive to light.  Pulmonary:     Effort: Pulmonary effort is normal.  Skin:    General: Skin is warm and dry.  Neurological:     Mental Status: She is  alert and oriented to person, place, and time.  Psychiatric:        Behavior: Behavior normal.     Ortho Exam awake alert and oriented x3.  Comfortable sitting.  Some pain directly over the left ischio but without any induration or skin changes.  Straight leg raise negative for back pain.  Neurologically intact.  No distal edema.  Pain is worse with extension of her leg over the issue and that it is not a true palpation.  No percussible back pain.  Specialty Comments:  No specialty comments available.  Imaging: No results found.   PMFS History: Patient Active Problem List   Diagnosis Date Noted  . Displaced fracture of lateral end of left clavicle, initial encounter for closed fracture 04/22/2017   History reviewed. No pertinent past medical history.  Family History  Problem Relation Age of Onset  . Breast cancer Mother 31  . Breast cancer Maternal Aunt        unsure of age late 26s    Past Surgical History:  Procedure Laterality Date  . AUGMENTATION MAMMAPLASTY Bilateral    saline  . REDUCTION MAMMAPLASTY Bilateral  Social History   Occupational History  . Not on file  Tobacco Use  . Smoking status: Former Research scientist (life sciences)  . Smokeless tobacco: Never Used  Substance and Sexual Activity  . Alcohol use: Not on file  . Drug use: Not on file  . Sexual activity: Not on file     Garald Balding, MD   Note - This record has been created using [employer].  Chart creation errors have been sought, but may not always  have been located. Such creation errors do not reflect on  the standard of medical care.

## 2018-07-23 ENCOUNTER — Other Ambulatory Visit: Payer: Self-pay | Admitting: Internal Medicine

## 2018-07-23 DIAGNOSIS — M8580 Other specified disorders of bone density and structure, unspecified site: Secondary | ICD-10-CM

## 2018-08-12 ENCOUNTER — Inpatient Hospital Stay: Admission: RE | Admit: 2018-08-12 | Payer: 59 | Source: Ambulatory Visit

## 2018-11-01 ENCOUNTER — Other Ambulatory Visit: Payer: PPO

## 2018-11-01 ENCOUNTER — Inpatient Hospital Stay: Admission: RE | Admit: 2018-11-01 | Payer: PPO | Source: Ambulatory Visit

## 2019-01-11 DIAGNOSIS — I10 Essential (primary) hypertension: Secondary | ICD-10-CM | POA: Diagnosis not present

## 2019-01-11 DIAGNOSIS — M859 Disorder of bone density and structure, unspecified: Secondary | ICD-10-CM | POA: Diagnosis not present

## 2019-01-18 DIAGNOSIS — G2581 Restless legs syndrome: Secondary | ICD-10-CM | POA: Diagnosis not present

## 2019-01-18 DIAGNOSIS — D1803 Hemangioma of intra-abdominal structures: Secondary | ICD-10-CM | POA: Diagnosis not present

## 2019-01-18 DIAGNOSIS — J302 Other seasonal allergic rhinitis: Secondary | ICD-10-CM | POA: Diagnosis not present

## 2019-01-18 DIAGNOSIS — M8580 Other specified disorders of bone density and structure, unspecified site: Secondary | ICD-10-CM | POA: Diagnosis not present

## 2019-01-18 DIAGNOSIS — M79601 Pain in right arm: Secondary | ICD-10-CM | POA: Diagnosis not present

## 2019-01-18 DIAGNOSIS — K5900 Constipation, unspecified: Secondary | ICD-10-CM | POA: Diagnosis not present

## 2019-01-18 DIAGNOSIS — D3613 Benign neoplasm of peripheral nerves and autonomic nervous system of lower limb, including hip: Secondary | ICD-10-CM | POA: Diagnosis not present

## 2019-01-18 DIAGNOSIS — G4700 Insomnia, unspecified: Secondary | ICD-10-CM | POA: Diagnosis not present

## 2019-01-18 DIAGNOSIS — K635 Polyp of colon: Secondary | ICD-10-CM | POA: Diagnosis not present

## 2019-01-18 DIAGNOSIS — Z0000 Encounter for general adult medical examination without abnormal findings: Secondary | ICD-10-CM | POA: Diagnosis not present

## 2019-01-18 DIAGNOSIS — F419 Anxiety disorder, unspecified: Secondary | ICD-10-CM | POA: Diagnosis not present

## 2019-01-18 DIAGNOSIS — I10 Essential (primary) hypertension: Secondary | ICD-10-CM | POA: Diagnosis not present

## 2019-02-08 ENCOUNTER — Encounter: Payer: Self-pay | Admitting: Orthopaedic Surgery

## 2019-02-08 ENCOUNTER — Ambulatory Visit (INDEPENDENT_AMBULATORY_CARE_PROVIDER_SITE_OTHER): Payer: PPO | Admitting: Orthopaedic Surgery

## 2019-02-08 ENCOUNTER — Ambulatory Visit (INDEPENDENT_AMBULATORY_CARE_PROVIDER_SITE_OTHER): Payer: PPO

## 2019-02-08 ENCOUNTER — Other Ambulatory Visit: Payer: Self-pay

## 2019-02-08 VITALS — BP 151/92 | HR 72 | Ht 65.5 in | Wt 145.0 lb

## 2019-02-08 DIAGNOSIS — M25559 Pain in unspecified hip: Secondary | ICD-10-CM

## 2019-02-08 DIAGNOSIS — G8929 Other chronic pain: Secondary | ICD-10-CM | POA: Diagnosis not present

## 2019-02-08 DIAGNOSIS — M545 Low back pain, unspecified: Secondary | ICD-10-CM

## 2019-02-08 NOTE — Progress Notes (Signed)
Office Visit Note   Patient: Kimberly Davenport           Date of Birth: [birth year]           MRN: [id number] Visit Date: 02/08/2019              Requested by: Shon Baton, [place],  [place] [id number] PCP: Shon Baton, MD   Assessment & Plan: Visit Diagnoses:  1. Chronic midline low back pain, unspecified whether sciatica present   2. Pain in joint involving pelvic region and thigh, unspecified laterality     Plan:  #1: MRI scan of the lumbar spine to rule out pathology.         May require MRI of the pelvis in the future #2: Limit her exercise program.  She will try water aerobics. #3: Follow back up after MRI scan.  Follow-Up Instructions: Return in about 2 weeks (around 02/22/2019).  Face-to-face time spent with patient was greater than 30 minutes.  Greater than 50% of the time was spent in counseling and coordination of care.  Orders:  Orders Placed This Encounter  Procedures  . XR Pelvis 1-2 Views  . XR Lumbar Spine 2-3 Views  . MR LUMBAR SPINE WO CONTRAST   No orders of the defined types were placed in this encounter.     Procedures: No procedures performed   Clinical Data: No additional findings.   Subjective: Chief Complaint  Patient presents with  . Lower Back - Pain  Patient presents today for pelvic pain. She said that it has been hurting for 6 months. No known injury. She said that she also has pain on the backside of both her upper legs. She is unable to bend over due to pain in her legs. She has had pain in her lower back, but that does not seem to hurt her today. She has been working out and notices that it causes her pain afterwards. She is taking OTC pain medicine.   HPI  Kimberly Davenport is a very pleasant 68 year old white female who presents today with pain in her pelvis as well as in her perineal area.  She also has some back pain.  She denies any recent history of injury or trauma but she has had a previous fracture in her lumbar spine  that of L1 from a been tossed off of a horse.  She states she does not really have any pain in that area.  Her pain is all in the pelvic area.  She is noted though that it is started with trying to work out and lose some weight and become more active and stronger.  Does not remember any history of injury or trauma recently with her exercise program.  Denies any numbness or tingling in the lower extremities.  Review of Systems  Constitutional: Negative.   HENT: Negative.   Respiratory: Negative.   Cardiovascular: Negative.   Endocrine: Negative.   Neurological: Negative.   Psychiatric/Behavioral: Negative.      Objective: Vital Signs: BP (!) 151/92   Pulse 72   Ht 5' 5.5" (1.664 m)   Wt 145 lb (65.8 kg)   BMI 23.76 kg/m   Physical Exam Constitutional:      Appearance: She is well-developed.  Eyes:     Pupils: Pupils are equal, round, and reactive to light.  Pulmonary:     Effort: Pulmonary effort is normal.  Skin:    General: Skin is warm and dry.  Neurological:  Mental Status: She is alert and oriented to person, place, and time.  Psychiatric:        Behavior: Behavior normal.     Ortho Exam  Today she has forward flexion lumbar spine to her fingertips at mid tibias bilaterally.  Backward extension about 20 to 30 degrees with minimal pain.  She has deep tendon reflexes 2+ bilateral symmetric in both lower extremities.  Excellent strength in both lower extremities.  Sensation is intact to light touch bilaterally in the lower extremities.  Good motion of the hips bilaterally.  Negative straight leg raising bilaterally.  She does not have pain per se with internal and external rotation of the hips.  She sits comfortably at 90 degrees of flexion of the hips.  No lumbar tenderness to palpation.  Specialty Comments:  No specialty comments available.  Imaging: Xr Lumbar Spine 2-3 Views  Result Date: 02/08/2019 2 view x-ray lumbar spine reveals an old L1 compression fracture  with approximately 30% compression noted.  She also has some sclerosis of the facets more at the L5-S1 bilaterally.  Mild deformity at the compression fracture site on the AP.  Xr Pelvis 1-2 Views  Result Date: 02/08/2019 AP pelvis reveals some mild sclerotic changes at the symphysis pubis.  Otherwise fairly unremarkable.    PMFS History: Patient Active Problem List   Diagnosis Date Noted  . Displaced fracture of lateral end of left clavicle, initial encounter for closed fracture 04/22/2017   History reviewed. No pertinent past medical history.  Family History  Problem Relation Age of Onset  . Breast cancer Mother 61  . Breast cancer Maternal Aunt        unsure of age late 70s    Past Surgical History:  Procedure Laterality Date  . AUGMENTATION MAMMAPLASTY Bilateral    saline  . REDUCTION MAMMAPLASTY Bilateral    Social History   Occupational History  . Not on file  Tobacco Use  . Smoking status: Former Research scientist (life sciences)  . Smokeless tobacco: Never Used  Substance and Sexual Activity  . Alcohol use: Not on file  . Drug use: Not on file  . Sexual activity: Not on file

## 2019-02-16 ENCOUNTER — Ambulatory Visit
Admission: RE | Admit: 2019-02-16 | Discharge: 2019-02-16 | Disposition: A | Discharge status: Home / Self Care | Payer: PPO | Source: Ambulatory Visit | Attending: Orthopaedic Surgery | Admitting: Orthopaedic Surgery

## 2019-02-16 ENCOUNTER — Other Ambulatory Visit: Payer: Self-pay

## 2019-02-16 DIAGNOSIS — G8929 Other chronic pain: Secondary | ICD-10-CM

## 2019-02-16 DIAGNOSIS — M48061 Spinal stenosis, lumbar region without neurogenic claudication: Secondary | ICD-10-CM | POA: Diagnosis not present

## 2019-02-16 DIAGNOSIS — M545 Low back pain, unspecified: Secondary | ICD-10-CM

## 2019-02-17 ENCOUNTER — Ambulatory Visit: Payer: PPO | Admitting: Orthopaedic Surgery

## 2019-02-17 ENCOUNTER — Encounter: Payer: Self-pay | Admitting: Orthopaedic Surgery

## 2019-02-17 ENCOUNTER — Other Ambulatory Visit: Payer: Self-pay

## 2019-02-17 ENCOUNTER — Ambulatory Visit (INDEPENDENT_AMBULATORY_CARE_PROVIDER_SITE_OTHER): Payer: PPO | Admitting: Orthopaedic Surgery

## 2019-02-17 DIAGNOSIS — M5442 Lumbago with sciatica, left side: Secondary | ICD-10-CM

## 2019-02-17 DIAGNOSIS — G8929 Other chronic pain: Secondary | ICD-10-CM

## 2019-02-17 DIAGNOSIS — M545 Low back pain, unspecified: Secondary | ICD-10-CM | POA: Insufficient documentation

## 2019-02-17 DIAGNOSIS — M5441 Lumbago with sciatica, right side: Secondary | ICD-10-CM

## 2019-02-17 NOTE — Progress Notes (Signed)
Office Visit Note   Patient: Kimberly Davenport           Date of Birth: 05-28-1951           MRN: [id number] Visit Date: 02/17/2019              Requested by: Shon Baton, [place],  [place] [id number] PCP: Shon Baton, MD   Assessment & Plan: Visit Diagnoses:  1. Chronic bilateral low back pain with bilateral sciatica     Plan: MRI scan demonstrates several levels of osteophyte formation in the lumbar spine.  At L4-5 there is a herniated disc to the right.  All scan was performed in 2007 and compared to the present scan.  That looks like a new disc rupture.  Mrs. Pecot is actually doing quite well at this time and not having any referred pain to either lower extremity.  She is not having any groin pain or significant back discomfort but she has been very inactive over the past 1 to 2 weeks.  Long discussion regarding the diagnosis and treatment options.  We will try a course of physical therapy with Barbaraann Barthel.  She can gradually increase her activities and if she starts having radicular pain I would consider an epidural steroid injection.  Neurologically she is intact.  Comprehensive office visit over about 30 minutes 50% of the time in counseling, reviewing her MRI scan  Follow-Up Instructions: Return if symptoms worsen or fail to improve.   Orders:  No orders of the defined types were placed in this encounter.  No orders of the defined types were placed in this encounter.     Procedures: No procedures performed   Clinical Data: No additional findings.   Subjective: Chief Complaint  Patient presents with  . Lower Back - Follow-up  Patient presents today for follow up on her lower back. She had an MRI on 02/16/2019 and is here today for those results.  Presently doing quite well and not having much pain.  She is not had any bowel or bladder changes.  And not having any referred pain to either lower extremity.  HPI  Review of Systems   Objective:  Vital Signs: BP (!) 159/91   Pulse 62   Ht 5' 5.5" (1.664 m)   Wt 145 lb (65.8 kg)   BMI 23.76 kg/m   Physical Exam Constitutional:      Appearance: She is well-developed.  Eyes:     Pupils: Pupils are equal, round, and reactive to light.  Pulmonary:     Effort: Pulmonary effort is normal.  Skin:    General: Skin is warm and dry.  Neurological:     Mental Status: She is alert and oriented to person, place, and time.  Psychiatric:        Behavior: Behavior normal.     Ortho Exam awake alert and oriented x3.  Comfortable sitting.  Straight leg raise is negative bilaterally.  Painless range of motion of both hips.  Reflexes were symmetrical and brisk.  Neurologically intact.  No percussible tenderness of the lumbar spine.  Specialty Comments:  No specialty comments available.  Imaging: No results found.   PMFS History: Patient Active Problem List   Diagnosis Date Noted  . Low back pain 02/17/2019  . Displaced fracture of lateral end of left clavicle, initial encounter for closed fracture 04/22/2017   History reviewed. No pertinent past medical history.  Family History  Problem Relation Age of Onset  .  Breast cancer Mother 34  . Breast cancer Maternal Aunt        unsure of age late 54s    Past Surgical History:  Procedure Laterality Date  . AUGMENTATION MAMMAPLASTY Bilateral    saline  . REDUCTION MAMMAPLASTY Bilateral    Social History   Occupational History  . Not on file  Tobacco Use  . Smoking status: Former Research scientist (life sciences)  . Smokeless tobacco: Never Used  Substance and Sexual Activity  . Alcohol use: Not on file  . Drug use: Not on file  . Sexual activity: Not on file

## 2019-02-17 NOTE — Addendum Note (Signed)
Addended by: Lendon Collar on: 02/17/2019 01:17 PM   Modules accepted: Orders

## 2019-02-22 ENCOUNTER — Ambulatory Visit: Payer: PPO | Admitting: Orthopaedic Surgery

## 2019-03-02 DIAGNOSIS — M545 Low back pain: Secondary | ICD-10-CM | POA: Diagnosis not present

## 2019-03-09 DIAGNOSIS — M545 Low back pain: Secondary | ICD-10-CM | POA: Diagnosis not present

## 2019-03-15 ENCOUNTER — Ambulatory Visit
Admission: RE | Admit: 2019-03-15 | Discharge: 2019-03-15 | Disposition: A | Discharge status: Home / Self Care | Payer: PPO | Source: Ambulatory Visit | Attending: Internal Medicine | Admitting: Internal Medicine

## 2019-03-15 ENCOUNTER — Other Ambulatory Visit: Payer: Self-pay

## 2019-03-15 ENCOUNTER — Ambulatory Visit
Admission: RE | Admit: 2019-03-15 | Discharge: 2019-03-15 | Disposition: A | Discharge status: Home / Self Care | Payer: PPO | Source: Ambulatory Visit | Attending: Obstetrics and Gynecology | Admitting: Obstetrics and Gynecology

## 2019-03-15 DIAGNOSIS — Z1231 Encounter for screening mammogram for malignant neoplasm of breast: Secondary | ICD-10-CM

## 2019-03-15 DIAGNOSIS — M8589 Other specified disorders of bone density and structure, multiple sites: Secondary | ICD-10-CM | POA: Diagnosis not present

## 2019-03-15 DIAGNOSIS — Z780 Asymptomatic menopausal state: Secondary | ICD-10-CM | POA: Diagnosis not present

## 2019-03-15 DIAGNOSIS — M8580 Other specified disorders of bone density and structure, unspecified site: Secondary | ICD-10-CM

## 2019-03-24 ENCOUNTER — Encounter: Payer: Self-pay | Admitting: Gastroenterology

## 2019-04-26 DIAGNOSIS — D485 Neoplasm of uncertain behavior of skin: Secondary | ICD-10-CM | POA: Diagnosis not present

## 2019-04-26 DIAGNOSIS — D2271 Melanocytic nevi of right lower limb, including hip: Secondary | ICD-10-CM | POA: Diagnosis not present

## 2019-04-26 DIAGNOSIS — L819 Disorder of pigmentation, unspecified: Secondary | ICD-10-CM | POA: Diagnosis not present

## 2019-04-26 DIAGNOSIS — D1801 Hemangioma of skin and subcutaneous tissue: Secondary | ICD-10-CM | POA: Diagnosis not present

## 2019-04-26 DIAGNOSIS — D225 Melanocytic nevi of trunk: Secondary | ICD-10-CM | POA: Diagnosis not present

## 2019-05-09 ENCOUNTER — Telehealth: Payer: Self-pay | Admitting: Orthopaedic Surgery

## 2019-05-09 NOTE — Telephone Encounter (Signed)
Tried to call. No answer. Left message for patient to call back for scheduling.

## 2019-05-09 NOTE — Telephone Encounter (Signed)
Pt boyfriend Elba Barman called in said he is neighbors/ good friend of dr.whitfield and he was told to call in for his girlfriend today to get her worked in Thursday 11/19 for knee pain?  Please give him a call [phone]

## 2019-05-10 ENCOUNTER — Telehealth: Payer: Self-pay | Admitting: Orthopaedic Surgery

## 2019-05-10 ENCOUNTER — Other Ambulatory Visit: Payer: Self-pay | Admitting: Orthopaedic Surgery

## 2019-05-10 MED ORDER — TRAMADOL HCL 50 MG PO TABS: ORAL_TABLET | ORAL | 0 refills | Status: DC

## 2019-05-10 NOTE — Telephone Encounter (Signed)
Please advise 

## 2019-05-10 NOTE — Telephone Encounter (Signed)
Spoke with patient. Sent in tramadol to pharmacy.

## 2019-05-10 NOTE — Telephone Encounter (Signed)
Tramadol 50mg  1-2 tabs po bid prn

## 2019-05-10 NOTE — Telephone Encounter (Signed)
Pt left message requesring something for pain until she can get in to see Dr.Whitfield on Thursday.

## 2019-05-12 ENCOUNTER — Ambulatory Visit (INDEPENDENT_AMBULATORY_CARE_PROVIDER_SITE_OTHER): Payer: PPO | Admitting: Orthopaedic Surgery

## 2019-05-12 ENCOUNTER — Encounter: Payer: Self-pay | Admitting: Orthopaedic Surgery

## 2019-05-12 ENCOUNTER — Other Ambulatory Visit: Payer: Self-pay

## 2019-05-12 ENCOUNTER — Ambulatory Visit: Payer: Self-pay

## 2019-05-12 VITALS — Ht 65.5 in | Wt 145.0 lb

## 2019-05-12 DIAGNOSIS — M25562 Pain in left knee: Secondary | ICD-10-CM | POA: Insufficient documentation

## 2019-05-12 NOTE — Progress Notes (Signed)
Office Visit Note   Patient: Kimberly Davenport           Date of Birth: 1951/05/06           MRN: [id number] Visit Date: 05/12/2019              Requested by: Shon Baton, [place],  [place] [id number] PCP: Shon Baton, MD   Assessment & Plan: Visit Diagnoses:  1. Acute pain of left knee     Plan: Onset of left knee pain approximately 3 weeks ago that appears to be localized on the lateral aspect of her knee.  No injury or trauma.  No swelling.  Has been jogging on the beach and performing Pilates.  Better in the last 24 hours taking Aleve.  X-rays were nondiagnostic.  Could be that she has a small tear of the lateral meniscus or possibly some early arthritis in the lateral compartment.  Comfortable taking Aleve and watching it over time.  In the future consider local cortisone injection or MRI scan to rule out internal derangement  Follow-Up Instructions: Return if symptoms worsen or fail to improve.   Orders:  Orders Placed This Encounter  Procedures  . XR KNEE 3 VIEW LEFT   No orders of the defined types were placed in this encounter.     Procedures: No procedures performed   Clinical Data: No additional findings.   Subjective: Chief Complaint  Patient presents with  . Left Knee - Pain  Patient presents today with left knee pain. She noticed pain in her knee with exercise class two weeks ago. She went to the beach and had a lot of inflammation after walking. She said most of her pain is lateral, but has moved medial as well. No swelling. She said that sometimes it will grind or catch. She has tried OTC meds, with no relief.  Problem with her lumbar spine but doesn't feel like that is the present issue.  No groin pain.  No numbness or tingling  HPI  Review of Systems   Objective: Vital Signs: Ht 5' 5.5" (1.664 m)   Wt 145 lb (65.8 kg)   BMI 23.76 kg/m   Physical Exam Constitutional:      Appearance: She is well-developed.  Eyes:   Pupils: Pupils are equal, round, and reactive to light.  Pulmonary:     Effort: Pulmonary effort is normal.  Skin:    General: Skin is warm and dry.  Neurological:     Mental Status: She is alert and oriented to person, place, and time.  Psychiatric:        Behavior: Behavior normal.     Ortho Exam left knee with full range of motion in flexion extension.  No instability.  No localized medial joint pain.  Very minimal discomfort over the mid lateral joint line.  No effusion.  No popping or clicking.  Palpable plica but without pain.  Minimal patellar crepitation.  No pain over the patella tendon or tibial tubercle.  No popliteal mass.  No calf pain.  No distal edema.  Very leg raise negative.  Painless range of motion left hip Specialty Comments:  No specialty comments available.  Imaging: Xr Knee 3 View Left  Result Date: 05/12/2019 Films of the left knee were obtained in several projections standing.  The joint spaces appear to be well-maintained.  No acute change or ectopic calcification.  Patient is symptomatic along the lateral aspect of her knee but I didn't see  any abnormality about the patella or either femoral condyle.  There is an area of radiolucency in the proximal lateral tibial plateau but also a similar finding in the asymptomatic right knee    PMFS History: Patient Active Problem List   Diagnosis Date Noted  . Pain in left knee 05/12/2019  . Low back pain 02/17/2019  . Displaced fracture of lateral end of left clavicle, initial encounter for closed fracture 04/22/2017   History reviewed. No pertinent past medical history.  Family History  Problem Relation Age of Onset  . Breast cancer Mother 53  . Breast cancer Maternal Aunt        unsure of age late 78s    Past Surgical History:  Procedure Laterality Date  . AUGMENTATION MAMMAPLASTY Bilateral    saline  . REDUCTION MAMMAPLASTY Bilateral    Social History   Occupational History  . Not on file  Tobacco  Use  . Smoking status: Former Research scientist (life sciences)  . Smokeless tobacco: Never Used  Substance and Sexual Activity  . Alcohol use: Not on file  . Drug use: Not on file  . Sexual activity: Not on file

## 2019-05-31 ENCOUNTER — Telehealth: Payer: Self-pay | Admitting: Orthopaedic Surgery

## 2019-05-31 ENCOUNTER — Other Ambulatory Visit: Payer: Self-pay | Admitting: Orthopaedic Surgery

## 2019-05-31 DIAGNOSIS — M25562 Pain in left knee: Secondary | ICD-10-CM

## 2019-05-31 DIAGNOSIS — G8929 Other chronic pain: Secondary | ICD-10-CM

## 2019-05-31 NOTE — Telephone Encounter (Signed)
Order has been entered and patient notified

## 2019-05-31 NOTE — Telephone Encounter (Signed)
Ok to schedule.

## 2019-05-31 NOTE — Telephone Encounter (Signed)
Pt called In wanting to let Dr.Whitfield know that she hasn't gotten any better with her left knee she is still having a lot of pain and would now like to proceed with scheduling an mri.   [phone]

## 2019-05-31 NOTE — Telephone Encounter (Signed)
Please advise 

## 2019-06-14 ENCOUNTER — Other Ambulatory Visit: Payer: Self-pay

## 2019-06-14 ENCOUNTER — Ambulatory Visit
Admission: RE | Admit: 2019-06-14 | Discharge: 2019-06-14 | Disposition: A | Discharge status: Home / Self Care | Payer: PPO | Source: Ambulatory Visit | Attending: Orthopaedic Surgery | Admitting: Orthopaedic Surgery

## 2019-06-14 DIAGNOSIS — G8929 Other chronic pain: Secondary | ICD-10-CM

## 2019-06-14 DIAGNOSIS — M25562 Pain in left knee: Secondary | ICD-10-CM | POA: Diagnosis not present

## 2019-06-15 NOTE — Progress Notes (Signed)
Called results.

## 2019-06-21 ENCOUNTER — Ambulatory Visit: Payer: PPO | Admitting: Orthopaedic Surgery

## 2019-08-07 ENCOUNTER — Ambulatory Visit: Payer: PPO

## 2020-01-17 DIAGNOSIS — M859 Disorder of bone density and structure, unspecified: Secondary | ICD-10-CM | POA: Diagnosis not present

## 2020-01-17 DIAGNOSIS — I10 Essential (primary) hypertension: Secondary | ICD-10-CM | POA: Diagnosis not present

## 2020-01-17 DIAGNOSIS — Z1212 Encounter for screening for malignant neoplasm of rectum: Secondary | ICD-10-CM | POA: Diagnosis not present

## 2020-01-19 DIAGNOSIS — Z23 Encounter for immunization: Secondary | ICD-10-CM | POA: Diagnosis not present

## 2020-01-19 DIAGNOSIS — D3613 Benign neoplasm of peripheral nerves and autonomic nervous system of lower limb, including hip: Secondary | ICD-10-CM | POA: Diagnosis not present

## 2020-01-19 DIAGNOSIS — I10 Essential (primary) hypertension: Secondary | ICD-10-CM | POA: Diagnosis not present

## 2020-01-19 DIAGNOSIS — K635 Polyp of colon: Secondary | ICD-10-CM | POA: Diagnosis not present

## 2020-01-19 DIAGNOSIS — K5900 Constipation, unspecified: Secondary | ICD-10-CM | POA: Diagnosis not present

## 2020-01-19 DIAGNOSIS — D1803 Hemangioma of intra-abdominal structures: Secondary | ICD-10-CM | POA: Diagnosis not present

## 2020-01-19 DIAGNOSIS — Z0000 Encounter for general adult medical examination without abnormal findings: Secondary | ICD-10-CM | POA: Diagnosis not present

## 2020-01-19 DIAGNOSIS — M8580 Other specified disorders of bone density and structure, unspecified site: Secondary | ICD-10-CM | POA: Diagnosis not present

## 2020-01-19 DIAGNOSIS — G2581 Restless legs syndrome: Secondary | ICD-10-CM | POA: Diagnosis not present

## 2020-01-19 DIAGNOSIS — G4700 Insomnia, unspecified: Secondary | ICD-10-CM | POA: Diagnosis not present

## 2020-01-19 DIAGNOSIS — J302 Other seasonal allergic rhinitis: Secondary | ICD-10-CM | POA: Diagnosis not present

## 2020-01-19 DIAGNOSIS — F419 Anxiety disorder, unspecified: Secondary | ICD-10-CM | POA: Diagnosis not present

## 2020-02-01 ENCOUNTER — Other Ambulatory Visit: Payer: Self-pay | Admitting: Internal Medicine

## 2020-02-01 DIAGNOSIS — Z1231 Encounter for screening mammogram for malignant neoplasm of breast: Secondary | ICD-10-CM

## 2020-03-19 ENCOUNTER — Ambulatory Visit: Payer: PPO

## 2020-03-26 DIAGNOSIS — D2261 Melanocytic nevi of right upper limb, including shoulder: Secondary | ICD-10-CM | POA: Diagnosis not present

## 2020-03-26 DIAGNOSIS — D225 Melanocytic nevi of trunk: Secondary | ICD-10-CM | POA: Diagnosis not present

## 2020-03-26 DIAGNOSIS — D2272 Melanocytic nevi of left lower limb, including hip: Secondary | ICD-10-CM | POA: Diagnosis not present

## 2020-03-26 DIAGNOSIS — D1801 Hemangioma of skin and subcutaneous tissue: Secondary | ICD-10-CM | POA: Diagnosis not present

## 2020-03-26 DIAGNOSIS — L814 Other melanin hyperpigmentation: Secondary | ICD-10-CM | POA: Diagnosis not present

## 2020-03-26 DIAGNOSIS — L821 Other seborrheic keratosis: Secondary | ICD-10-CM | POA: Diagnosis not present

## 2020-03-28 DIAGNOSIS — Z23 Encounter for immunization: Secondary | ICD-10-CM | POA: Diagnosis not present

## 2020-04-03 ENCOUNTER — Other Ambulatory Visit: Payer: Self-pay

## 2020-04-03 ENCOUNTER — Ambulatory Visit
Admission: RE | Admit: 2020-04-03 | Discharge: 2020-04-03 | Disposition: A | Discharge status: Home / Self Care | Payer: PPO | Source: Ambulatory Visit | Attending: Internal Medicine | Admitting: Internal Medicine

## 2020-04-03 DIAGNOSIS — Z1231 Encounter for screening mammogram for malignant neoplasm of breast: Secondary | ICD-10-CM

## 2020-05-07 DIAGNOSIS — I10 Essential (primary) hypertension: Secondary | ICD-10-CM | POA: Diagnosis not present

## 2020-05-07 DIAGNOSIS — Z86010 Personal history of colonic polyps: Secondary | ICD-10-CM | POA: Diagnosis not present

## 2020-05-07 DIAGNOSIS — K5900 Constipation, unspecified: Secondary | ICD-10-CM | POA: Diagnosis not present

## 2020-05-09 DIAGNOSIS — B0052 Herpesviral keratitis: Secondary | ICD-10-CM | POA: Diagnosis not present

## 2020-05-09 DIAGNOSIS — H40013 Open angle with borderline findings, low risk, bilateral: Secondary | ICD-10-CM | POA: Diagnosis not present

## 2020-05-21 ENCOUNTER — Ambulatory Visit (INDEPENDENT_AMBULATORY_CARE_PROVIDER_SITE_OTHER): Payer: PPO

## 2020-05-21 ENCOUNTER — Ambulatory Visit: Payer: PPO | Admitting: Podiatry

## 2020-05-21 ENCOUNTER — Encounter: Payer: Self-pay | Admitting: Podiatry

## 2020-05-21 ENCOUNTER — Other Ambulatory Visit: Payer: Self-pay

## 2020-05-21 DIAGNOSIS — M79671 Pain in right foot: Secondary | ICD-10-CM | POA: Diagnosis not present

## 2020-05-21 DIAGNOSIS — M2041 Other hammer toe(s) (acquired), right foot: Secondary | ICD-10-CM

## 2020-05-21 DIAGNOSIS — M779 Enthesopathy, unspecified: Secondary | ICD-10-CM

## 2020-05-21 DIAGNOSIS — D3610 Benign neoplasm of peripheral nerves and autonomic nervous system, unspecified: Secondary | ICD-10-CM

## 2020-05-22 NOTE — Progress Notes (Signed)
Subjective:   Patient ID: Kimberly Davenport, female   DOB: 69 y.o.   MRN: [id number]   HPI Patient presents stating she still been getting pain in her right foot and states that it has also created some cramping and seems to have done well with her neuroma but still gets discomfort which was hard for her to describe.  Also states the third toe on the right foot has become very tender   Review of Systems  All other systems reviewed and are negative.       Objective:  Physical Exam Vitals and nursing note reviewed.  Constitutional:      Appearance: She is well-developed.  Pulmonary:     Effort: Pulmonary effort is normal.  Musculoskeletal:        General: Normal range of motion.  Skin:    General: Skin is warm.  Neurological:     Mental Status: She is alert.     Neurovascular status was found to be intact muscle strength was found to be adequate range of motion adequate with patient having mild forefoot pain right but not severe but having severe discomfort third digit right at the inner phalangeal joint with fluid buildup around the medial side of the joint and lateral side of the joint surface.  Patient gives vague complaints of pain in both feet but it was difficult to exactly understand the pathology     Assessment:  Possibility for inflammatory capsulitis inner phalangeal joint digit 3 right versus neuroma symptomatology or forefoot capsulitis     Plan:  H&P reviewed all conditions discussed.  We will get a focus on the toe as it seems worse at this time and I did do sterile prep and I injected the inner phalangeal joint capsule 2 mg Dexasone Kenalog 3 mg Xylocaine debrided the lesion applied padding advised on wider shoes and explained possible arthroplasty or other possible treatments or procedures depending on response  X-rays indicate satisfactory positioning with no indications of arthritis or fracture

## 2020-06-19 ENCOUNTER — Other Ambulatory Visit: Payer: Self-pay | Admitting: Podiatry

## 2020-06-19 DIAGNOSIS — M779 Enthesopathy, unspecified: Secondary | ICD-10-CM

## 2020-06-21 DIAGNOSIS — R059 Cough, unspecified: Secondary | ICD-10-CM | POA: Diagnosis not present

## 2020-06-21 DIAGNOSIS — Z1152 Encounter for screening for COVID-19: Secondary | ICD-10-CM | POA: Diagnosis not present

## 2020-06-21 DIAGNOSIS — Z20822 Contact with and (suspected) exposure to covid-19: Secondary | ICD-10-CM | POA: Diagnosis not present

## 2020-06-21 DIAGNOSIS — R21 Rash and other nonspecific skin eruption: Secondary | ICD-10-CM | POA: Diagnosis not present

## 2020-06-21 DIAGNOSIS — J019 Acute sinusitis, unspecified: Secondary | ICD-10-CM | POA: Diagnosis not present

## 2020-06-21 DIAGNOSIS — B002 Herpesviral gingivostomatitis and pharyngotonsillitis: Secondary | ICD-10-CM | POA: Diagnosis not present

## 2020-06-26 DIAGNOSIS — Z86010 Personal history of colonic polyps: Secondary | ICD-10-CM | POA: Diagnosis not present

## 2020-07-02 ENCOUNTER — Ambulatory Visit: Payer: PPO | Admitting: Podiatry

## 2020-07-04 ENCOUNTER — Ambulatory Visit: Payer: PPO | Admitting: Podiatry

## 2020-10-17 DIAGNOSIS — M7741 Metatarsalgia, right foot: Secondary | ICD-10-CM | POA: Diagnosis not present

## 2020-10-23 DIAGNOSIS — H20012 Primary iridocyclitis, left eye: Secondary | ICD-10-CM | POA: Diagnosis not present

## 2020-10-23 DIAGNOSIS — B0052 Herpesviral keratitis: Secondary | ICD-10-CM | POA: Diagnosis not present

## 2020-10-26 DIAGNOSIS — B0052 Herpesviral keratitis: Secondary | ICD-10-CM | POA: Diagnosis not present

## 2020-11-09 DIAGNOSIS — H16142 Punctate keratitis, left eye: Secondary | ICD-10-CM | POA: Diagnosis not present

## 2020-11-22 DIAGNOSIS — H16142 Punctate keratitis, left eye: Secondary | ICD-10-CM | POA: Diagnosis not present

## 2020-12-12 DIAGNOSIS — M7741 Metatarsalgia, right foot: Secondary | ICD-10-CM | POA: Diagnosis not present

## 2020-12-19 DIAGNOSIS — H16142 Punctate keratitis, left eye: Secondary | ICD-10-CM | POA: Diagnosis not present

## 2020-12-26 DIAGNOSIS — H16143 Punctate keratitis, bilateral: Secondary | ICD-10-CM | POA: Diagnosis not present

## 2021-01-14 DIAGNOSIS — M6701 Short Achilles tendon (acquired), right ankle: Secondary | ICD-10-CM | POA: Diagnosis not present

## 2021-01-14 DIAGNOSIS — M2041 Other hammer toe(s) (acquired), right foot: Secondary | ICD-10-CM | POA: Diagnosis not present

## 2021-01-14 DIAGNOSIS — M7741 Metatarsalgia, right foot: Secondary | ICD-10-CM | POA: Diagnosis not present

## 2021-01-30 DIAGNOSIS — H16143 Punctate keratitis, bilateral: Secondary | ICD-10-CM | POA: Diagnosis not present

## 2021-02-12 DIAGNOSIS — M79601 Pain in right arm: Secondary | ICD-10-CM | POA: Diagnosis not present

## 2021-02-14 DIAGNOSIS — M79601 Pain in right arm: Secondary | ICD-10-CM | POA: Diagnosis not present

## 2021-03-12 DIAGNOSIS — K635 Polyp of colon: Secondary | ICD-10-CM | POA: Diagnosis not present

## 2021-03-12 DIAGNOSIS — M25511 Pain in right shoulder: Secondary | ICD-10-CM | POA: Diagnosis not present

## 2021-03-12 DIAGNOSIS — I10 Essential (primary) hypertension: Secondary | ICD-10-CM | POA: Diagnosis not present

## 2021-03-12 DIAGNOSIS — M8580 Other specified disorders of bone density and structure, unspecified site: Secondary | ICD-10-CM | POA: Diagnosis not present

## 2021-03-12 DIAGNOSIS — K5900 Constipation, unspecified: Secondary | ICD-10-CM | POA: Diagnosis not present

## 2021-03-12 DIAGNOSIS — R140 Abdominal distension (gaseous): Secondary | ICD-10-CM | POA: Diagnosis not present

## 2021-03-12 DIAGNOSIS — Z23 Encounter for immunization: Secondary | ICD-10-CM | POA: Diagnosis not present

## 2021-03-21 ENCOUNTER — Other Ambulatory Visit: Payer: Self-pay | Admitting: Internal Medicine

## 2021-03-21 DIAGNOSIS — Z1231 Encounter for screening mammogram for malignant neoplasm of breast: Secondary | ICD-10-CM

## 2021-03-25 DIAGNOSIS — M7541 Impingement syndrome of right shoulder: Secondary | ICD-10-CM | POA: Diagnosis not present

## 2021-03-26 ENCOUNTER — Other Ambulatory Visit: Payer: Self-pay | Admitting: Orthopedic Surgery

## 2021-03-26 DIAGNOSIS — M25511 Pain in right shoulder: Secondary | ICD-10-CM

## 2021-04-02 ENCOUNTER — Ambulatory Visit
Admission: RE | Admit: 2021-04-02 | Discharge: 2021-04-02 | Disposition: A | Discharge status: Home / Self Care | Payer: PPO | Source: Ambulatory Visit | Attending: Orthopedic Surgery | Admitting: Orthopedic Surgery

## 2021-04-02 ENCOUNTER — Other Ambulatory Visit: Payer: Self-pay

## 2021-04-02 DIAGNOSIS — M25511 Pain in right shoulder: Secondary | ICD-10-CM

## 2021-04-12 DIAGNOSIS — M25511 Pain in right shoulder: Secondary | ICD-10-CM | POA: Diagnosis not present

## 2021-04-16 DIAGNOSIS — M25511 Pain in right shoulder: Secondary | ICD-10-CM | POA: Diagnosis not present

## 2021-04-17 ENCOUNTER — Ambulatory Visit
Admission: RE | Admit: 2021-04-17 | Discharge: 2021-04-17 | Disposition: A | Discharge status: Home / Self Care | Payer: PPO | Source: Ambulatory Visit | Attending: Internal Medicine | Admitting: Internal Medicine

## 2021-04-17 ENCOUNTER — Other Ambulatory Visit: Payer: Self-pay

## 2021-04-17 DIAGNOSIS — Z1231 Encounter for screening mammogram for malignant neoplasm of breast: Secondary | ICD-10-CM | POA: Diagnosis not present

## 2021-04-25 DIAGNOSIS — D485 Neoplasm of uncertain behavior of skin: Secondary | ICD-10-CM | POA: Diagnosis not present

## 2021-04-25 DIAGNOSIS — H02834 Dermatochalasis of left upper eyelid: Secondary | ICD-10-CM | POA: Diagnosis not present

## 2021-04-25 DIAGNOSIS — I781 Nevus, non-neoplastic: Secondary | ICD-10-CM | POA: Diagnosis not present

## 2021-04-25 DIAGNOSIS — H02831 Dermatochalasis of right upper eyelid: Secondary | ICD-10-CM | POA: Diagnosis not present

## 2021-05-01 DIAGNOSIS — Z01419 Encounter for gynecological examination (general) (routine) without abnormal findings: Secondary | ICD-10-CM | POA: Diagnosis not present

## 2021-05-08 ENCOUNTER — Other Ambulatory Visit: Payer: Self-pay | Admitting: Obstetrics and Gynecology

## 2021-05-08 DIAGNOSIS — E2839 Other primary ovarian failure: Secondary | ICD-10-CM

## 2021-05-13 DIAGNOSIS — H40013 Open angle with borderline findings, low risk, bilateral: Secondary | ICD-10-CM | POA: Diagnosis not present

## 2021-07-17 DIAGNOSIS — D225 Melanocytic nevi of trunk: Secondary | ICD-10-CM | POA: Diagnosis not present

## 2021-07-17 DIAGNOSIS — D1801 Hemangioma of skin and subcutaneous tissue: Secondary | ICD-10-CM | POA: Diagnosis not present

## 2021-07-17 DIAGNOSIS — L570 Actinic keratosis: Secondary | ICD-10-CM | POA: Diagnosis not present

## 2021-07-17 DIAGNOSIS — L720 Epidermal cyst: Secondary | ICD-10-CM | POA: Diagnosis not present

## 2021-07-17 DIAGNOSIS — D2261 Melanocytic nevi of right upper limb, including shoulder: Secondary | ICD-10-CM | POA: Diagnosis not present

## 2021-07-17 DIAGNOSIS — L821 Other seborrheic keratosis: Secondary | ICD-10-CM | POA: Diagnosis not present

## 2021-07-24 DIAGNOSIS — L84 Corns and callosities: Secondary | ICD-10-CM | POA: Diagnosis not present

## 2021-07-24 DIAGNOSIS — M2041 Other hammer toe(s) (acquired), right foot: Secondary | ICD-10-CM | POA: Diagnosis not present

## 2021-08-08 DIAGNOSIS — Z9882 Breast implant status: Secondary | ICD-10-CM | POA: Diagnosis not present

## 2021-08-08 DIAGNOSIS — M542 Cervicalgia: Secondary | ICD-10-CM | POA: Diagnosis not present

## 2021-08-08 DIAGNOSIS — M549 Dorsalgia, unspecified: Secondary | ICD-10-CM | POA: Diagnosis not present

## 2021-08-08 DIAGNOSIS — G8929 Other chronic pain: Secondary | ICD-10-CM | POA: Diagnosis not present

## 2021-08-16 ENCOUNTER — Other Ambulatory Visit (HOSPITAL_COMMUNITY): Payer: Self-pay

## 2021-08-16 DIAGNOSIS — M542 Cervicalgia: Secondary | ICD-10-CM | POA: Diagnosis not present

## 2021-08-16 DIAGNOSIS — M25511 Pain in right shoulder: Secondary | ICD-10-CM | POA: Diagnosis not present

## 2021-08-16 MED ORDER — GABAPENTIN 100 MG PO CAPS
ORAL_CAPSULE | ORAL | 1 refills | Status: DC
  Filled 2021-08-16: qty 90, 30d supply, fill #0

## 2021-08-19 ENCOUNTER — Other Ambulatory Visit (HOSPITAL_COMMUNITY): Payer: Self-pay

## 2021-08-27 DIAGNOSIS — M542 Cervicalgia: Secondary | ICD-10-CM | POA: Diagnosis not present

## 2021-08-27 DIAGNOSIS — M47892 Other spondylosis, cervical region: Secondary | ICD-10-CM | POA: Diagnosis not present

## 2021-08-27 DIAGNOSIS — M5412 Radiculopathy, cervical region: Secondary | ICD-10-CM | POA: Diagnosis not present

## 2021-09-03 ENCOUNTER — Encounter: Payer: Self-pay | Admitting: Plastic Surgery

## 2021-09-03 ENCOUNTER — Other Ambulatory Visit: Payer: Self-pay

## 2021-09-03 ENCOUNTER — Ambulatory Visit: Payer: Self-pay | Admitting: Plastic Surgery

## 2021-09-03 DIAGNOSIS — Z719 Counseling, unspecified: Secondary | ICD-10-CM

## 2021-09-04 DIAGNOSIS — L84 Corns and callosities: Secondary | ICD-10-CM | POA: Diagnosis not present

## 2021-09-05 ENCOUNTER — Encounter: Payer: Self-pay | Admitting: Plastic Surgery

## 2021-09-05 DIAGNOSIS — Z719 Counseling, unspecified: Secondary | ICD-10-CM | POA: Insufficient documentation

## 2021-09-05 NOTE — Progress Notes (Signed)
? ?  Kimberly Davenport ID: Kimberly Davenport, female    DOB: April 16, 1951, 71 y.o.   MRN: [id number] ? ? ?Chief Complaint  ?Kimberly Davenport presents with  ? New Cosmetic  ? Breast Problem  ? ? ?The Kimberly Davenport is a 71 year old female here for consultation for breast surgery.  Approximately 20 years ago Kimberly Davenport underwent implant placement.  Kimberly Davenport has began 68 saline implants.  They are 24 cc and filled to 270 cc bilaterally Kimberly Davenport knows Kimberly Davenport has to make a change and also wants mastopexy.  Kimberly Davenport is trying to decide whether or not to just do the mastopexy or mastopexy with implant exchange.  Kimberly Davenport is 5 feet 6 inches tall and weighs 144 pounds.  Based on her exam it looks like Kimberly Davenport had a lift/reduction in the past as well.  Her mammogram is up-to-date. ? ?Left implant: Ref # [id number], SN [id number], Lot # [id number] ?Right implant: Ref # [id number], Serial # [id number], Lot # [id number] ? ? ?Review of Systems  ?Constitutional: Negative.   ?HENT: Negative.    ?Eyes: Negative.   ?Respiratory: Negative.  Negative for chest tightness.   ?Cardiovascular:  Negative for leg swelling.  ?Gastrointestinal: Negative.  Negative for abdominal pain.  ?Endocrine: Negative.   ?Genitourinary: Negative.   ?Musculoskeletal: Negative.   ?Neurological: Negative.   ?Hematological: Negative.   ?Psychiatric/Behavioral: Negative.    ? ?History reviewed. No pertinent past medical history.  ?Past Surgical History:  ?Procedure Laterality Date  ? AUGMENTATION MAMMAPLASTY Bilateral   ? saline  ? REDUCTION MAMMAPLASTY Bilateral   ?  ? ? ?Current Outpatient Medications:  ?  amLODipine (NORVASC) 5 MG tablet, amlodipine 5 mg tablet  TAKE 1 TABLET BY MOUTH EVERY DAY, Disp: , Rfl:  ?  escitalopram (LEXAPRO) 20 MG tablet, Take 20 mg by mouth daily., Disp: , Rfl: 3 ?  gabapentin (NEURONTIN) 100 MG capsule, Take 1 capsule (100 mg total) by mouth at bedtime for 3 days, THEN 1 capsule (100 mg total) 3 (three) times daily., Disp: 90 capsule, Rfl: 1 ?  HYDROcodone-acetaminophen (NORCO) 10-325 MG tablet, Take 1  tablet by mouth every 6 (six) hours as needed., Disp: , Rfl:  ?  magnesium 30 MG tablet, Take 30 mg by mouth 2 (two) times daily., Disp: , Rfl:  ?  meloxicam (MOBIC) 15 MG tablet, Take 1 tablet (15 mg total) by mouth daily., Disp: 30 tablet, Rfl: 2 ?  metaxalone (SKELAXIN) 800 MG tablet, Take 1 tablet (800 mg total) by mouth 3 (three) times daily. (Kimberly Davenport taking differently: Take 800 mg by mouth as needed.), Disp: 30 tablet, Rfl: 0 ?  traMADol (ULTRAM) 50 MG tablet, 1-2 PO BID prn pain, Disp: 30 tablet, Rfl: 0  ? ?Objective:  ? ?Vitals:  ? 09/03/21 1450  ?BP: (!) 150/90  ?Pulse: 85  ?SpO2: 95%  ? ? ?Physical Exam ?Vitals reviewed.  ?Constitutional:   ?   Appearance: Normal appearance.  ?HENT:  ?   Head: Normocephalic and atraumatic.  ?Cardiovascular:  ?   Rate and Rhythm: Normal rate.  ?   Pulses: Normal pulses.  ?Pulmonary:  ?   Effort: Pulmonary effort is normal.  ?Abdominal:  ?   General: There is no distension.  ?   Palpations: Abdomen is soft.  ?   Tenderness: There is no abdominal tenderness.  ?Musculoskeletal:     ?   General: No swelling or deformity.  ?Skin: ?   Capillary Refill: Capillary refill takes less than 2 seconds.  ?   Coloration:  Skin is not jaundiced.  ?   Findings: No bruising.  ?Neurological:  ?   Mental Status: Kimberly Davenport is alert and oriented to person, place, and time.  ?Psychiatric:     ?   Mood and Affect: Mood normal.     ?   Behavior: Behavior normal.     ?   Thought Content: Thought content normal.     ?   Judgment: Judgment normal.  ? ? ?Assessment & Plan:  ?Encounter for consultation ? ?The Kimberly Davenport is interested in removal of bilateral implants and possible placement likely saline with mastopexy.  Kimberly Davenport would like the pricing on with and without implants. ? ?Pictures were obtained of the Kimberly Davenport and placed in the chart with the Kimberly Davenport's or guardian's permission. ? ? ?Loel Lofty Iqra Rotundo, DO ?

## 2021-09-09 ENCOUNTER — Telehealth: Payer: Self-pay

## 2021-09-09 NOTE — Telephone Encounter (Signed)
Pt called and wanted an estimate for breast replacement and would like to have a call back. ?

## 2021-09-09 NOTE — Telephone Encounter (Signed)
Pt called back and reported current breast implant information. I adv pt that I would forward information to Dr. Marla Roe. She conveyed understanding.  ? ?[address] 68 Saline ? ?270 cc bilaterally ? ?L implant ?Ref # [id number] ?SN [id number] ?Lot # [id number] ? ?R implant ?Ref # [id number] ?Serial # [id number] ?Lot # [id number] ? ?

## 2021-09-10 ENCOUNTER — Encounter: Payer: Self-pay | Admitting: Plastic Surgery

## 2021-09-18 DIAGNOSIS — M47892 Other spondylosis, cervical region: Secondary | ICD-10-CM | POA: Diagnosis not present

## 2021-09-18 DIAGNOSIS — M5412 Radiculopathy, cervical region: Secondary | ICD-10-CM | POA: Diagnosis not present

## 2021-09-18 DIAGNOSIS — M542 Cervicalgia: Secondary | ICD-10-CM | POA: Diagnosis not present

## 2021-09-19 DIAGNOSIS — M542 Cervicalgia: Secondary | ICD-10-CM | POA: Diagnosis not present

## 2021-09-19 DIAGNOSIS — M5412 Radiculopathy, cervical region: Secondary | ICD-10-CM | POA: Diagnosis not present

## 2021-09-19 DIAGNOSIS — M47892 Other spondylosis, cervical region: Secondary | ICD-10-CM | POA: Diagnosis not present

## 2021-09-20 DIAGNOSIS — M47892 Other spondylosis, cervical region: Secondary | ICD-10-CM | POA: Diagnosis not present

## 2021-09-24 DIAGNOSIS — M47892 Other spondylosis, cervical region: Secondary | ICD-10-CM | POA: Diagnosis not present

## 2021-09-24 DIAGNOSIS — M5412 Radiculopathy, cervical region: Secondary | ICD-10-CM | POA: Diagnosis not present

## 2021-09-24 DIAGNOSIS — M542 Cervicalgia: Secondary | ICD-10-CM | POA: Diagnosis not present

## 2021-10-01 ENCOUNTER — Ambulatory Visit (INDEPENDENT_AMBULATORY_CARE_PROVIDER_SITE_OTHER): Payer: Self-pay | Admitting: Surgical

## 2021-10-01 ENCOUNTER — Encounter: Payer: Self-pay | Admitting: Surgical

## 2021-10-01 VITALS — BP 146/79 | HR 81 | Ht 66.0 in | Wt 142.0 lb

## 2021-10-01 DIAGNOSIS — Z719 Counseling, unspecified: Secondary | ICD-10-CM

## 2021-10-01 MED ORDER — HYDROCODONE-ACETAMINOPHEN 5-325 MG PO TABS: 1.0000 | ORAL_TABLET | Freq: Four times a day (QID) | ORAL | 0 refills | Status: AC | PRN

## 2021-10-01 MED ORDER — CEPHALEXIN 500 MG PO CAPS: 500.0000 mg | ORAL_CAPSULE | Freq: Four times a day (QID) | ORAL | 0 refills | Status: AC

## 2021-10-01 MED ORDER — ONDANSETRON HCL 4 MG PO TABS: 4.0000 mg | ORAL_TABLET | Freq: Three times a day (TID) | ORAL | 0 refills | Status: DC | PRN

## 2021-10-01 NOTE — Progress Notes (Signed)
? ?  Patient ID: Kimberly Davenport, female    DOB: 26-Nov-1950, 71 y.o.   MRN: [id number] ? ?Chief Complaint  ?Patient presents with  ? Pre-op Exam  ? ? ?  ICD-10-CM   ?1. Encounter for consultation  Z71.9   ?  ? ? ?History of Present Illness: ?Kimberly Davenport is a 71 y.o.  female  with a history of breast augmentation 20 years ago.  She presents for preoperative evaluation for upcoming procedure, Removal of bilateral breast implants with replacement with saline implants and bilateral mastopexy scheduled for 10/09/2021 with Dr. Marla Roe. ? ?The patient has not had problems with anesthesia. No history of DVT/PE.  No family history of DVT/PE.  No family or personal history of bleeding or clotting disorders.  Patient is not currently taking any blood thinners.  No history of CVA/MI.  ? ?Summary of Previous Visit: Patient with breast implant placement approximately 20 years ago.  She has saline implants.  They are filled to 270 cc bilaterally.  She wants a mastopexy.  Based on her exam it looks like she has had a lift/reduction in the past as well.  Her mammogram is up-to-date. ? ?PMH Significant for: Hypertension, reduction mammoplasty/augmentation mammoplasty ? ?Patient reports she is doing well, she feels well.  She has not had any recent changes in her health.  She denies any cardiac or pulmonary symptoms. ? ?**Patient reports her main goal is to be perkier, have more volume in her upper breast pole and to be smaller.  She would like to be about a C to D cup, but would like to err on the side of C cup.** ? ?Past Medical History: ?Allergies: ?No Known Allergies ? ?Current Medications: ? ?Current Outpatient Medications:  ?  amLODipine (NORVASC) 5 MG tablet, amlodipine 5 mg tablet  TAKE 1 TABLET BY MOUTH EVERY DAY, Disp: , Rfl:  ?  cephALEXin (KEFLEX) 500 MG capsule, Take 1 capsule (500 mg total) by mouth 4 (four) times daily for 5 days., Disp: 20 capsule, Rfl: 0 ?  gabapentin (NEURONTIN) 100 MG capsule, Take 1  capsule (100 mg total) by mouth at bedtime for 3 days, THEN 1 capsule (100 mg total) 3 (three) times daily., Disp: 90 capsule, Rfl: 1 ?  HYDROcodone-acetaminophen (NORCO) 5-325 MG tablet, Take 1 tablet by mouth every 6 (six) hours as needed for up to 5 days for severe pain., Disp: 20 tablet, Rfl: 0 ?  metaxalone (SKELAXIN) 800 MG tablet, Take 1 tablet (800 mg total) by mouth 3 (three) times daily. (Patient taking differently: Take 800 mg by mouth as needed.), Disp: 30 tablet, Rfl: 0 ?  ondansetron (ZOFRAN) 4 MG tablet, Take 1 tablet (4 mg total) by mouth every 8 (eight) hours as needed for nausea or vomiting., Disp: 20 tablet, Rfl: 0 ? ?Past Medical Problems: ?No past medical history on file. ? ?Past Surgical History: ?Past Surgical History:  ?Procedure Laterality Date  ? AUGMENTATION MAMMAPLASTY Bilateral   ? saline  ? REDUCTION MAMMAPLASTY Bilateral   ? ? ?Social History: ?Social History  ? ?Socioeconomic History  ? Marital status: Married  ?  Spouse name: Not on file  ? Number of children: Not on file  ? Years of education: Not on file  ? Highest education level: Not on file  ?Occupational History  ? Not on file  ?Tobacco Use  ? Smoking status: Former  ? Smokeless tobacco: Never  ?Substance and Sexual Activity  ? Alcohol use: Not on file  ?  Drug use: Not on file  ? Sexual activity: Not on file  ?Other Topics Concern  ? Not on file  ?Social History Narrative  ? Not on file  ? ?Social Determinants of Health  ? ?Financial Resource Strain: Not on file  ?Food Insecurity: Not on file  ?Transportation Needs: Not on file  ?Physical Activity: Not on file  ?Stress: Not on file  ?Social Connections: Not on file  ?Intimate Partner Violence: Not on file  ? ? ?Family History: ?Family History  ?Problem Relation Age of Onset  ? Breast cancer Mother 18  ? Breast cancer Maternal Aunt   ?     unsure of age late 36s  ? ? ?Review of Systems: ?Review of Systems  ?Constitutional: Negative.   ?Respiratory: Negative.    ?Cardiovascular:  Negative.   ? ?Physical Exam: ?Vital Signs ?BP (!) 146/79 (BP Location: Left Arm, Patient Position: Sitting, Cuff Size: Normal)   Pulse 81   Ht '5\' 6"'$  (1.676 m)   Wt 142 lb (64.4 kg)   SpO2 96%   BMI 22.92 kg/m?  ? ?Physical Exam  ?Constitutional:   ?   General: Not in acute distress. ?   Appearance: Normal appearance. Not ill-appearing.  ?HENT:  ?   Head: Normocephalic and atraumatic.  ?Eyes:  ?   Pupils: Pupils are equal, round ?Neck:  ?   Musculoskeletal: Normal range of motion.  ?Cardiovascular:  ?   Rate and Rhythm: Normal rate ?   Pulses: Normal pulses.  ?Pulmonary:  ?   Effort: Pulmonary effort is normal. No respiratory distress.  ?Musculoskeletal: Normal range of motion.  ?Skin: ?   General: Skin is warm and dry.  ?   Findings: No erythema or rash.  ?Neurological:  ?   General: No focal deficit present.  ?   Mental Status: Alert and oriented to person, place, and time. Mental status is at baseline.  ?   Motor: No weakness.  ?Psychiatric:     ?   Mood and Affect: Mood normal.     ?   Behavior: Behavior normal.  ? ? ?Assessment/Plan: ?The patient is scheduled for exchange of bilateral breast implants with bilateral mastopexy with Dr. Marla Roe.  Risks, benefits, and alternatives of procedure discussed, questions answered and consent obtained.   ? ?Smoking Status: Non-smoker; Counseling Given?  N/A ?Last Mammogram: 04/17/2021; Results: Negative, BI-RADS 1 ? ?Caprini Score: 4, moderate; Risk Factors include: Age and length of planned surgery. Recommendation for mechanical  prophylaxis. Encourage early ambulation.  ? ?Pictures obtained: '@consult'$  ? ?Post-op Rx sent to pharmacy: Norco, Zofran, Keflex ? ?Patient was provided with the General Surgical Risk consent document and Pain Medication Agreement prior to their appointment.  They had adequate time to read through the risk consent documents and Pain Medication Agreement. We also discussed them in person together during this preop appointment. All of their  questions were answered to their satisfaction.  Recommended calling if they have any further questions.  Risk consent form and Pain Medication Agreement to be scanned into patient's chart. ? ?Patient was provided with the [employer] implant patient decision checklist and this was completed during today's preoperative evaluation. Patient had time to read through the information and any questions were answered to their content. Form will be scanned into patient's chart. ? ?The risks that can be encountered with and after placement of a breast implant were discussed and include the following but not limited to these: ?bleeding, infection, delayed healing, anesthesia risks, skin sensation  changes, injury to structures including nerves, blood vessels, and muscles which may be temporary or permanent, allergies to tape, suture materials and glues, blood products, topical preparations or injected agents, skin contour irregularities, skin discoloration and swelling, deep vein thrombosis, cardiac and pulmonary complications, pain, which may persist, fluid accumulation, wrinkling of the skin over the implanmt, changes in nipple or breast sensation, implant leakage or rupture, faulty position of the implant, persistent pain, formation of tight scar tissue around the implant (capsular contracture). ? ?The risk that can be encountered with mastopexy/breast lift were discussed and include the following but not limited to these:  Breast asymmetry, fluid accumulation, firmness of the breast, inability to breast feed, loss of nipple or areola, skin loss, decrease or no nipple sensation, fat necrosis of the breast tissue, bleeding, infection, healing delay.  There are risks of anesthesia, changes to skin sensation and injury to nerves or blood vessels.  The muscle can be temporarily or permanently injured.  You may have an allergic reaction to tape, suture, glue, blood products which can result in skin discoloration, swelling, pain, skin  lesions, poor healing.  Any of these can lead to the need for revisonal surgery or stage procedures.  A mastopexy has potential to interfere with diagnostic procedures.  Nipple or breast piercing can increas

## 2021-10-10 ENCOUNTER — Telehealth: Payer: Self-pay | Admitting: Plastic Surgery

## 2021-10-10 NOTE — Telephone Encounter (Signed)
Pt is calling in stating that she did not get all of the medications that she was told that would be called in for her.  Pt state that she is missing the valium.  Pharm:  [facility] ?

## 2021-10-17 DIAGNOSIS — I872 Venous insufficiency (chronic) (peripheral): Secondary | ICD-10-CM | POA: Diagnosis not present

## 2021-10-17 DIAGNOSIS — I87303 Chronic venous hypertension (idiopathic) without complications of bilateral lower extremity: Secondary | ICD-10-CM | POA: Diagnosis not present

## 2021-10-17 NOTE — Progress Notes (Signed)
Patient is a 71 year old female here for follow-up after bilateral breast implant removal and replacement with implants and bilateral mastopexy with Dr. Marla Roe on 10/09/2021.  She is 8 days postop. ? ?Intraoperatively she had 225 cc gel implants placed.  Smooth round high-profile. ? ?Patient reports overall she is doing well, she has had a difficult time finding a compressive garment that fits her well.  She reports she has tried multiple bras, she feels very swollen.  She does have a lot of tenderness in the bilateral lateral breast.   ? ?Chaperone present on exam ?On exam bilateral NAC's are viable, bilateral breast incisions appear intact and healing well.  Steri-Strips still in place.  Bilateral breast with subcutaneous fluid collection noted with palpation.  She has some swelling and bruising noted laterally as well as extending to her lower abdomen.  ? ?Recommend continue with compressive garments, patient was provided with Ace wrap today given the significant swelling.  Given the implant in place, no needle aspiration was attempted -increased compression.  We discussed reasons to return for evaluation sooner than her next scheduled appointment.  Recommend calling with any questions or concerns.  I do not see any signs of infection on exam. ? ?

## 2021-10-18 ENCOUNTER — Ambulatory Visit (INDEPENDENT_AMBULATORY_CARE_PROVIDER_SITE_OTHER): Payer: PPO | Admitting: Surgical

## 2021-10-18 DIAGNOSIS — Z719 Counseling, unspecified: Secondary | ICD-10-CM

## 2021-10-21 ENCOUNTER — Telehealth: Payer: Self-pay | Admitting: Plastic Surgery

## 2021-10-21 NOTE — Telephone Encounter (Signed)
Patient called to follow up on previous inquiry; requesting call back. Patient wants to know if she needs to be seen by the provider before her next appt scheduled for 10/29/21 due to swelling. Please advise at [phone]. ?

## 2021-10-21 NOTE — Telephone Encounter (Signed)
Per Catalina [place]ig[place], pt can see Dr. Keturah Barre if she has any openings before 5/9, if not pt can wait until her 5/9 appt. Pt conveyed understanding.  ?

## 2021-10-29 ENCOUNTER — Ambulatory Visit (INDEPENDENT_AMBULATORY_CARE_PROVIDER_SITE_OTHER): Payer: PPO | Admitting: Plastic Surgery

## 2021-10-29 ENCOUNTER — Encounter: Payer: Self-pay | Admitting: Plastic Surgery

## 2021-10-29 DIAGNOSIS — I83813 Varicose veins of bilateral lower extremities with pain: Secondary | ICD-10-CM | POA: Diagnosis not present

## 2021-10-29 DIAGNOSIS — M7989 Other specified soft tissue disorders: Secondary | ICD-10-CM | POA: Diagnosis not present

## 2021-10-29 DIAGNOSIS — Z719 Counseling, unspecified: Secondary | ICD-10-CM

## 2021-10-29 NOTE — Progress Notes (Signed)
The patient is a 71 year old female here for follow-up after undergoing breast surgery.  Had implants placed 20 years ago replace which was done 3 weeks ago. She still has some swelling and bruising but overall doing very well.  No sign of hematoma or seroma.  Incisions healing well.   ? ?Pictures were obtained of the patient and placed in the chart with the patient's or guardian's permission. ? ?

## 2021-11-03 ENCOUNTER — Encounter: Payer: Self-pay | Admitting: Plastic Surgery

## 2021-11-07 DIAGNOSIS — I83893 Varicose veins of bilateral lower extremities with other complications: Secondary | ICD-10-CM | POA: Diagnosis not present

## 2021-11-25 NOTE — Progress Notes (Deleted)
Patient is a 71 year old female who underwent bilateral breast implant removal and replacement with implants and bilateral mastopexy with Dr. Marla Roe on 10/09/2021.  She had smooth round high-profile 225 cc gel implants placed.  Patient was last seen in the clinic on 10/29/2021.  Overall she was doing well.  She did have some swelling and bruising but there were no signs of hematoma or seroma.  Today, patient is here for postoperative follow-up.

## 2021-11-26 ENCOUNTER — Ambulatory Visit: Payer: PPO | Admitting: Student

## 2021-12-02 NOTE — Progress Notes (Addendum)
Patient is a 71 year old female who underwent bilateral breast implant removal and replacement with implants of bilateral mastopexy with Dr. Marla Roe on 10/09/2021.  Patient had 225 cc smooth round high-profile gel implants placed intraoperatively.  Patient is here today for postoperative follow-up.  Patient was last seen in the clinic on 10/29/2021.  Patient was doing well but she did have some swelling and bruising.  The incisions were healing well at this visit.  Today, patient reports she is doing well overall.  She states that she feels pretty good.  She reports the incisions have been healing well.  She does report that she still has some swelling around the breasts bilaterally but it has gone down since her last visit.  Patient states that she has been able to start exercising again like she was before.  Chaperone present on exam.  On exam, patient is sitting upright in no acute distress.  Breasts are fairly symmetrical and soft.  The incision sites are healing very well.  NAC's are viable bilaterally there is no erythema or ecchymosis.  There are no signs of infection.  There is some minor swelling inferior aspects of the breast bilaterally.  Discussed with patient that she should continue compression.  Discussed with patient that she may use scar creams if she would like.  Patient to follow-up in 1 month.  Pictures were obtained and placed in the patient's chart with patient permission.  Objective findings and plan discussed with Dr. Marla Roe

## 2021-12-03 ENCOUNTER — Ambulatory Visit (INDEPENDENT_AMBULATORY_CARE_PROVIDER_SITE_OTHER): Payer: Self-pay | Admitting: Student

## 2021-12-03 DIAGNOSIS — Z719 Counseling, unspecified: Secondary | ICD-10-CM

## 2021-12-05 DIAGNOSIS — L84 Corns and callosities: Secondary | ICD-10-CM | POA: Diagnosis not present

## 2021-12-05 DIAGNOSIS — M2041 Other hammer toe(s) (acquired), right foot: Secondary | ICD-10-CM | POA: Diagnosis not present

## 2022-01-02 NOTE — Progress Notes (Signed)
Patient is a 71 year old female who underwent bilateral breast implant removal and replacement with implants of bilateral mastopexy with Dr. Marla Roe on 10/09/2021.  Patient had 225 cc smooth round high-profile gel implants placed intraoperatively.  Patient presents to the clinic for postoperative follow-up.  Patient was last seen in the clinic on 12/03/2021.  Patient reported she was doing well overall.  She stated she felt there was still little swelling around her breast, but felt it was improving.  On exam there was some minimal swelling noted at the inferior aspect of the breasts bilaterally.  Patient was instructed to follow-up in a month.  Today, patient reports she is doing well.  She states that she is overall happy with her results.  She states she had neck and shoulder pain prior to surgery and now her pain is completely gone after her implant exchange.  Patient reports that the medial aspect of her breasts bother her at this time.  She states she feels there is some lack of volume to the medial aspect of her breasts.   Chaperone present on exam.  On exam, patient is sitting upright in no acute distress.  Breasts are fairly symmetrical and soft.  Incisions have healed well.  There is a very small area of firmness underneath the right vertical limb incision.  There is no erythema or drainage from the incisions.  There is some lack of volume to the medial aspect of each breast.  Discussed with patient to give the implants a little more time to settle out.  Discussed with the patient to monitor her breasts.  Discussed that if she does not feel there are any changes, she should give [place] a call back to set up an appointment to discuss different options to improve the medial aspect of the breasts.  Also discussed with patient to gently massage the area underneath her incision as this is most likely scarring.  Discussed with the patient to continue her sports bra for few more weeks.  Pictures were  obtained of the patient and placed in the patient's chart with the patient's permission.  Dr. Marla Roe also examined the patient. Plan discussed with Dr. Marla Roe.

## 2022-01-03 ENCOUNTER — Ambulatory Visit (INDEPENDENT_AMBULATORY_CARE_PROVIDER_SITE_OTHER): Payer: PPO | Admitting: Student

## 2022-01-03 ENCOUNTER — Encounter: Payer: Self-pay | Admitting: Student

## 2022-01-03 DIAGNOSIS — Z719 Counseling, unspecified: Secondary | ICD-10-CM

## 2022-04-24 DIAGNOSIS — M8589 Other specified disorders of bone density and structure, multiple sites: Secondary | ICD-10-CM | POA: Diagnosis not present

## 2022-04-24 DIAGNOSIS — I10 Essential (primary) hypertension: Secondary | ICD-10-CM | POA: Diagnosis not present

## 2022-04-24 DIAGNOSIS — R7989 Other specified abnormal findings of blood chemistry: Secondary | ICD-10-CM | POA: Diagnosis not present

## 2022-04-24 DIAGNOSIS — Z79899 Other long term (current) drug therapy: Secondary | ICD-10-CM | POA: Diagnosis not present

## 2022-04-28 DIAGNOSIS — L570 Actinic keratosis: Secondary | ICD-10-CM | POA: Diagnosis not present

## 2022-05-01 ENCOUNTER — Other Ambulatory Visit: Payer: Self-pay | Admitting: Internal Medicine

## 2022-05-01 ENCOUNTER — Telehealth: Payer: Self-pay

## 2022-05-01 DIAGNOSIS — Z1331 Encounter for screening for depression: Secondary | ICD-10-CM | POA: Diagnosis not present

## 2022-05-01 DIAGNOSIS — Z0000 Encounter for general adult medical examination without abnormal findings: Secondary | ICD-10-CM | POA: Diagnosis not present

## 2022-05-01 DIAGNOSIS — M542 Cervicalgia: Secondary | ICD-10-CM | POA: Diagnosis not present

## 2022-05-01 DIAGNOSIS — L84 Corns and callosities: Secondary | ICD-10-CM | POA: Diagnosis not present

## 2022-05-01 DIAGNOSIS — K5900 Constipation, unspecified: Secondary | ICD-10-CM | POA: Diagnosis not present

## 2022-05-01 DIAGNOSIS — F419 Anxiety disorder, unspecified: Secondary | ICD-10-CM | POA: Diagnosis not present

## 2022-05-01 DIAGNOSIS — M8580 Other specified disorders of bone density and structure, unspecified site: Secondary | ICD-10-CM | POA: Diagnosis not present

## 2022-05-01 DIAGNOSIS — R82998 Other abnormal findings in urine: Secondary | ICD-10-CM | POA: Diagnosis not present

## 2022-05-01 DIAGNOSIS — I10 Essential (primary) hypertension: Secondary | ICD-10-CM | POA: Diagnosis not present

## 2022-05-01 DIAGNOSIS — I83893 Varicose veins of bilateral lower extremities with other complications: Secondary | ICD-10-CM | POA: Diagnosis not present

## 2022-05-01 DIAGNOSIS — M25511 Pain in right shoulder: Secondary | ICD-10-CM | POA: Diagnosis not present

## 2022-05-01 DIAGNOSIS — Z23 Encounter for immunization: Secondary | ICD-10-CM | POA: Diagnosis not present

## 2022-05-01 DIAGNOSIS — R140 Abdominal distension (gaseous): Secondary | ICD-10-CM | POA: Diagnosis not present

## 2022-05-01 NOTE — Telephone Encounter (Signed)
Called patient and informed message.

## 2022-05-01 NOTE — Telephone Encounter (Signed)
Patient had surgery with Dr.Dillingham back in April of this year and is due for a mammogram, wants to know if it will be safe for her to schedule one now?

## 2022-05-05 ENCOUNTER — Other Ambulatory Visit: Payer: Self-pay | Admitting: Internal Medicine

## 2022-05-05 DIAGNOSIS — L84 Corns and callosities: Secondary | ICD-10-CM | POA: Diagnosis not present

## 2022-05-05 DIAGNOSIS — Z1231 Encounter for screening mammogram for malignant neoplasm of breast: Secondary | ICD-10-CM

## 2022-05-05 DIAGNOSIS — M2041 Other hammer toe(s) (acquired), right foot: Secondary | ICD-10-CM | POA: Diagnosis not present

## 2022-05-28 DIAGNOSIS — L84 Corns and callosities: Secondary | ICD-10-CM | POA: Diagnosis not present

## 2022-05-28 DIAGNOSIS — M2041 Other hammer toe(s) (acquired), right foot: Secondary | ICD-10-CM | POA: Diagnosis not present

## 2022-06-05 ENCOUNTER — Other Ambulatory Visit: Payer: PPO

## 2022-07-02 ENCOUNTER — Ambulatory Visit
Admission: RE | Admit: 2022-07-02 | Discharge: 2022-07-02 | Disposition: A | Discharge status: Home / Self Care | Payer: PPO | Source: Ambulatory Visit | Attending: Internal Medicine | Admitting: Internal Medicine

## 2022-07-02 DIAGNOSIS — Z1231 Encounter for screening mammogram for malignant neoplasm of breast: Secondary | ICD-10-CM | POA: Diagnosis not present

## 2022-07-03 ENCOUNTER — Ambulatory Visit
Admission: RE | Admit: 2022-07-03 | Discharge: 2022-07-03 | Disposition: A | Discharge status: Home / Self Care | Payer: No Typology Code available for payment source | Source: Ambulatory Visit | Attending: Internal Medicine | Admitting: Internal Medicine

## 2022-07-03 DIAGNOSIS — E7800 Pure hypercholesterolemia, unspecified: Secondary | ICD-10-CM | POA: Diagnosis not present

## 2022-07-03 DIAGNOSIS — Z0000 Encounter for general adult medical examination without abnormal findings: Secondary | ICD-10-CM

## 2022-07-08 DIAGNOSIS — I83891 Varicose veins of right lower extremities with other complications: Secondary | ICD-10-CM | POA: Diagnosis not present

## 2022-07-08 DIAGNOSIS — M7989 Other specified soft tissue disorders: Secondary | ICD-10-CM | POA: Diagnosis not present

## 2022-07-08 DIAGNOSIS — I83811 Varicose veins of right lower extremities with pain: Secondary | ICD-10-CM | POA: Diagnosis not present

## 2022-07-16 DIAGNOSIS — H2513 Age-related nuclear cataract, bilateral: Secondary | ICD-10-CM | POA: Diagnosis not present

## 2022-07-16 DIAGNOSIS — H18413 Arcus senilis, bilateral: Secondary | ICD-10-CM | POA: Diagnosis not present

## 2022-07-16 DIAGNOSIS — H40013 Open angle with borderline findings, low risk, bilateral: Secondary | ICD-10-CM | POA: Diagnosis not present

## 2022-07-22 DIAGNOSIS — I83892 Varicose veins of left lower extremities with other complications: Secondary | ICD-10-CM | POA: Diagnosis not present

## 2022-07-29 ENCOUNTER — Other Ambulatory Visit: Payer: Self-pay | Admitting: Internal Medicine

## 2022-07-29 DIAGNOSIS — M5432 Sciatica, left side: Secondary | ICD-10-CM

## 2022-07-29 DIAGNOSIS — M5431 Sciatica, right side: Secondary | ICD-10-CM | POA: Diagnosis not present

## 2022-07-30 ENCOUNTER — Ambulatory Visit
Admission: RE | Admit: 2022-07-30 | Discharge: 2022-07-30 | Disposition: A | Discharge status: Home / Self Care | Payer: PPO | Source: Ambulatory Visit | Attending: Internal Medicine | Admitting: Internal Medicine

## 2022-07-30 DIAGNOSIS — M545 Low back pain, unspecified: Secondary | ICD-10-CM | POA: Diagnosis not present

## 2022-07-30 DIAGNOSIS — M47816 Spondylosis without myelopathy or radiculopathy, lumbar region: Secondary | ICD-10-CM | POA: Diagnosis not present

## 2022-07-30 DIAGNOSIS — S32010A Wedge compression fracture of first lumbar vertebra, initial encounter for closed fracture: Secondary | ICD-10-CM | POA: Diagnosis not present

## 2022-07-30 DIAGNOSIS — R29898 Other symptoms and signs involving the musculoskeletal system: Secondary | ICD-10-CM | POA: Diagnosis not present

## 2022-07-30 DIAGNOSIS — M5432 Sciatica, left side: Secondary | ICD-10-CM

## 2022-07-30 DIAGNOSIS — M48061 Spinal stenosis, lumbar region without neurogenic claudication: Secondary | ICD-10-CM | POA: Diagnosis not present

## 2022-08-13 DIAGNOSIS — M48061 Spinal stenosis, lumbar region without neurogenic claudication: Secondary | ICD-10-CM | POA: Diagnosis not present

## 2022-08-13 DIAGNOSIS — M5416 Radiculopathy, lumbar region: Secondary | ICD-10-CM | POA: Diagnosis not present

## 2022-08-14 DIAGNOSIS — M5416 Radiculopathy, lumbar region: Secondary | ICD-10-CM | POA: Diagnosis not present

## 2022-08-14 DIAGNOSIS — L84 Corns and callosities: Secondary | ICD-10-CM | POA: Diagnosis not present

## 2022-08-14 DIAGNOSIS — M2041 Other hammer toe(s) (acquired), right foot: Secondary | ICD-10-CM | POA: Diagnosis not present

## 2022-09-15 DIAGNOSIS — M545 Low back pain, unspecified: Secondary | ICD-10-CM | POA: Diagnosis not present

## 2022-09-15 DIAGNOSIS — M48061 Spinal stenosis, lumbar region without neurogenic claudication: Secondary | ICD-10-CM | POA: Diagnosis not present

## 2022-09-15 DIAGNOSIS — M5416 Radiculopathy, lumbar region: Secondary | ICD-10-CM | POA: Diagnosis not present

## 2022-09-24 DIAGNOSIS — L84 Corns and callosities: Secondary | ICD-10-CM | POA: Diagnosis not present

## 2022-09-24 DIAGNOSIS — M2041 Other hammer toe(s) (acquired), right foot: Secondary | ICD-10-CM | POA: Diagnosis not present

## 2022-10-09 DIAGNOSIS — L84 Corns and callosities: Secondary | ICD-10-CM | POA: Diagnosis not present

## 2022-10-09 DIAGNOSIS — M2041 Other hammer toe(s) (acquired), right foot: Secondary | ICD-10-CM | POA: Diagnosis not present

## 2022-11-11 DIAGNOSIS — D1801 Hemangioma of skin and subcutaneous tissue: Secondary | ICD-10-CM | POA: Diagnosis not present

## 2022-11-11 DIAGNOSIS — L821 Other seborrheic keratosis: Secondary | ICD-10-CM | POA: Diagnosis not present

## 2022-11-11 DIAGNOSIS — I788 Other diseases of capillaries: Secondary | ICD-10-CM | POA: Diagnosis not present

## 2022-11-11 DIAGNOSIS — D2272 Melanocytic nevi of left lower limb, including hip: Secondary | ICD-10-CM | POA: Diagnosis not present

## 2022-11-11 DIAGNOSIS — D2271 Melanocytic nevi of right lower limb, including hip: Secondary | ICD-10-CM | POA: Diagnosis not present

## 2022-11-11 DIAGNOSIS — L309 Dermatitis, unspecified: Secondary | ICD-10-CM | POA: Diagnosis not present

## 2022-11-13 DIAGNOSIS — L84 Corns and callosities: Secondary | ICD-10-CM | POA: Diagnosis not present

## 2022-11-13 DIAGNOSIS — M2041 Other hammer toe(s) (acquired), right foot: Secondary | ICD-10-CM | POA: Diagnosis not present

## 2022-12-08 DIAGNOSIS — L84 Corns and callosities: Secondary | ICD-10-CM | POA: Diagnosis not present

## 2022-12-08 DIAGNOSIS — M2041 Other hammer toe(s) (acquired), right foot: Secondary | ICD-10-CM | POA: Diagnosis not present

## 2022-12-29 DIAGNOSIS — M2041 Other hammer toe(s) (acquired), right foot: Secondary | ICD-10-CM | POA: Diagnosis not present

## 2022-12-29 DIAGNOSIS — L84 Corns and callosities: Secondary | ICD-10-CM | POA: Diagnosis not present

## 2023-01-30 DIAGNOSIS — M2041 Other hammer toe(s) (acquired), right foot: Secondary | ICD-10-CM | POA: Diagnosis not present

## 2023-01-30 DIAGNOSIS — L84 Corns and callosities: Secondary | ICD-10-CM | POA: Diagnosis not present

## 2023-02-03 IMAGING — MG DIGITAL SCREENING BREAST BILAT IMPLANT W/ TOMO W/ CAD
9 of 12 series · 9 of 28 positions shown · non-contrast
Comparison: Previous exam(s).

CLINICAL DATA: Screening.

EXAM:
DIGITAL SCREENING BILATERAL MAMMOGRAM WITH IMPLANTS, CAD AND
TOMOSYNTHESIS
TECHNIQUE: Bilateral screening digital craniocaudal and mediolateral oblique
mammograms were obtained. Bilateral screening digital breast
tomosynthesis was performed. The images were evaluated with
computer-aided detection. Standard and/or implant displaced views
were performed.

[L CC]
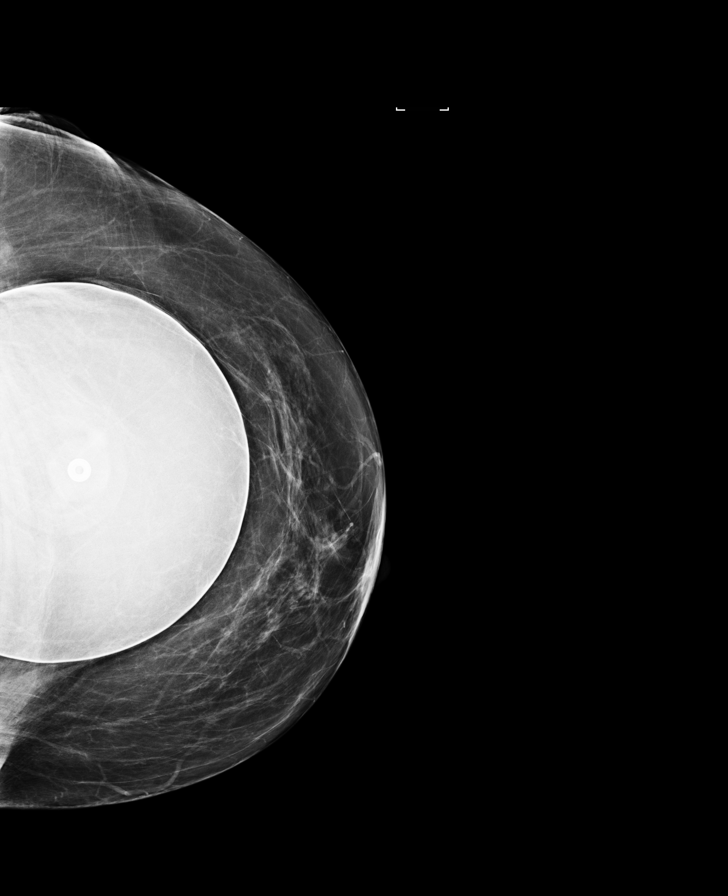

[R MLO]
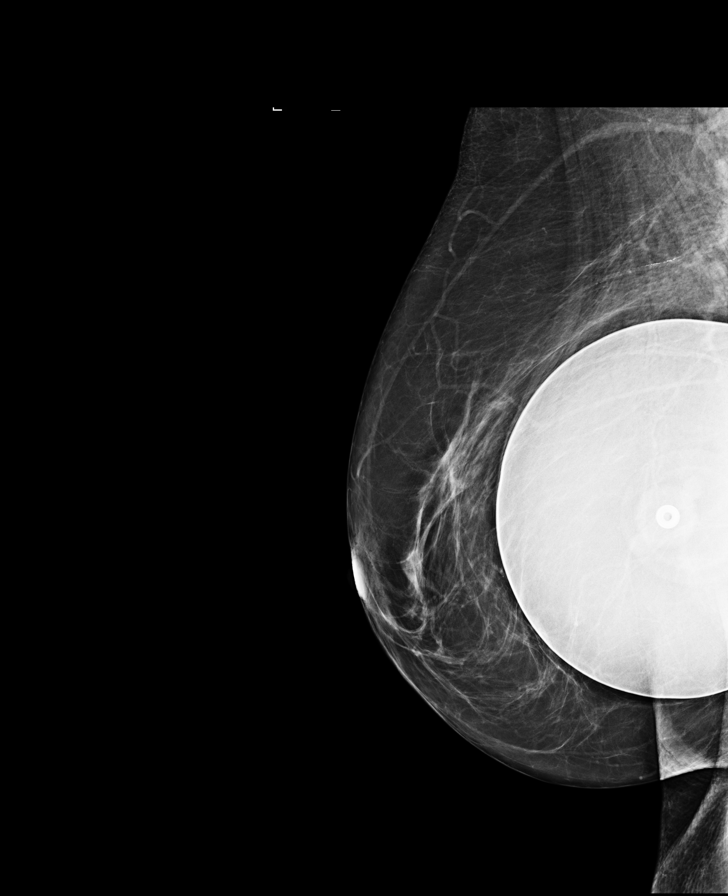

[R CC]
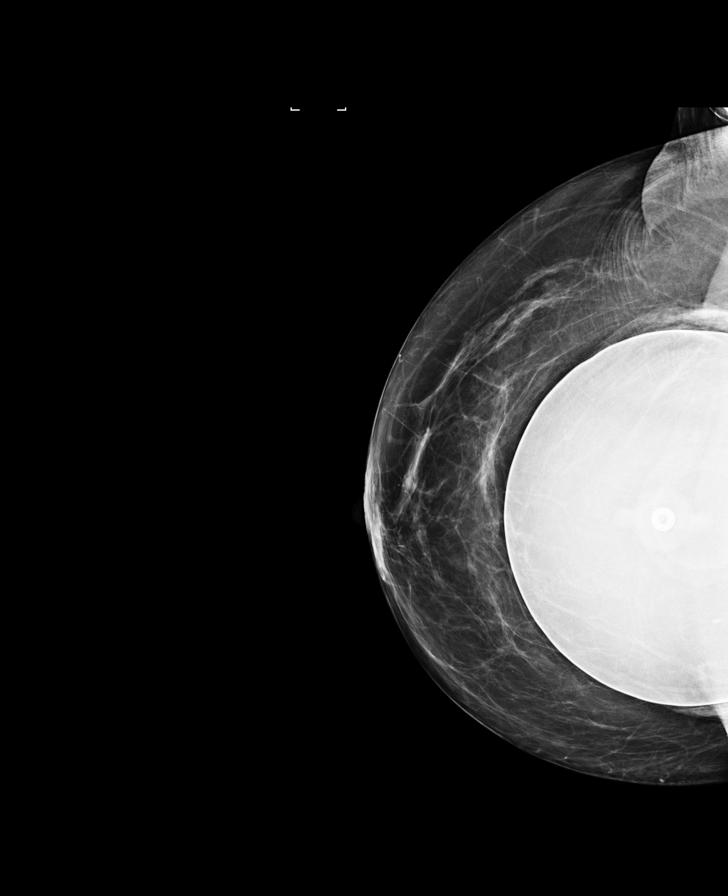

[L MLO]
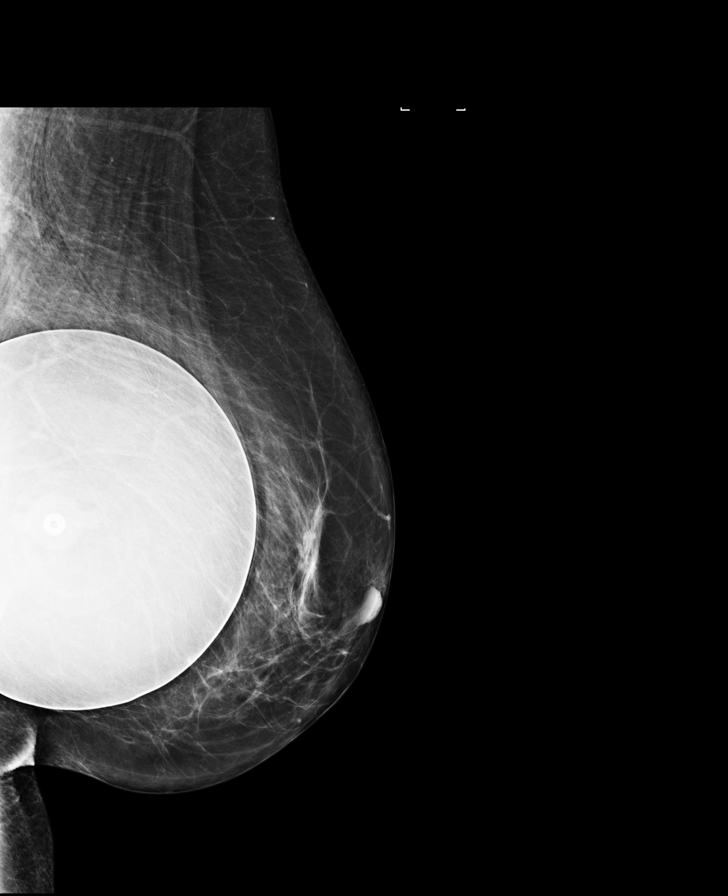

[R MLO synth-2D]
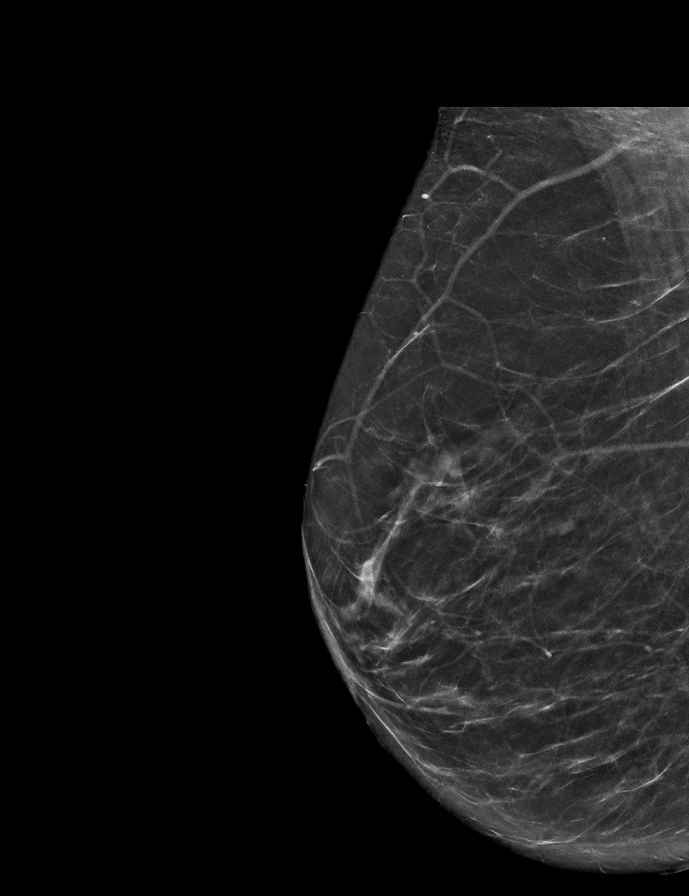

[L CC synth-2D]
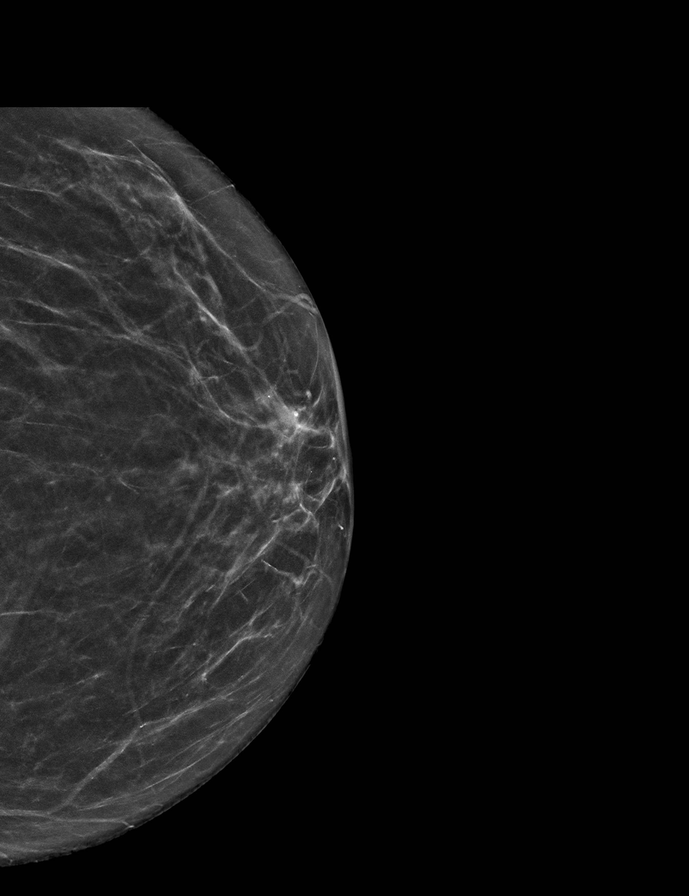

[L MLO synth-2D]
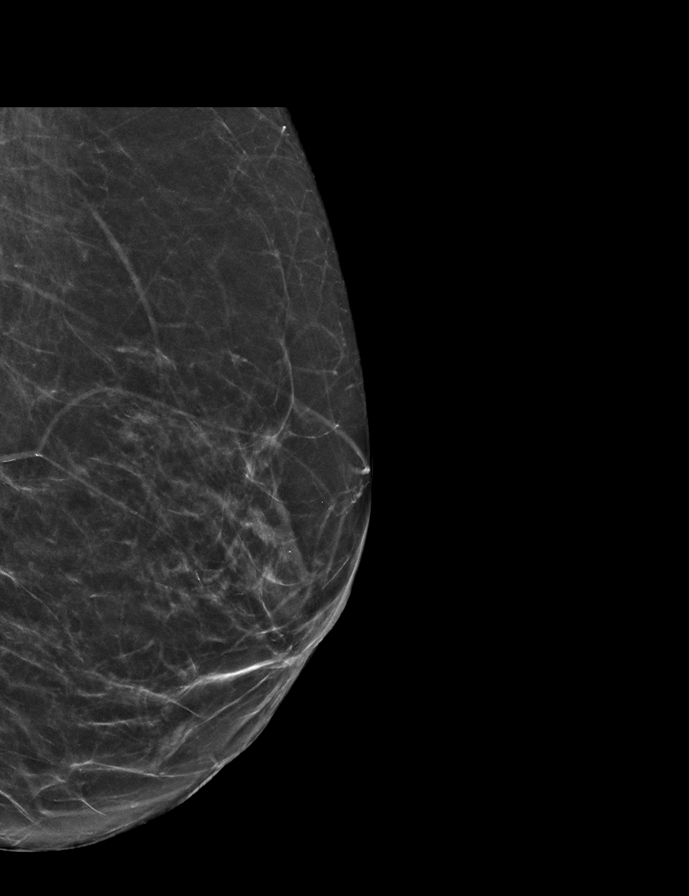

[R CC synth-2D]
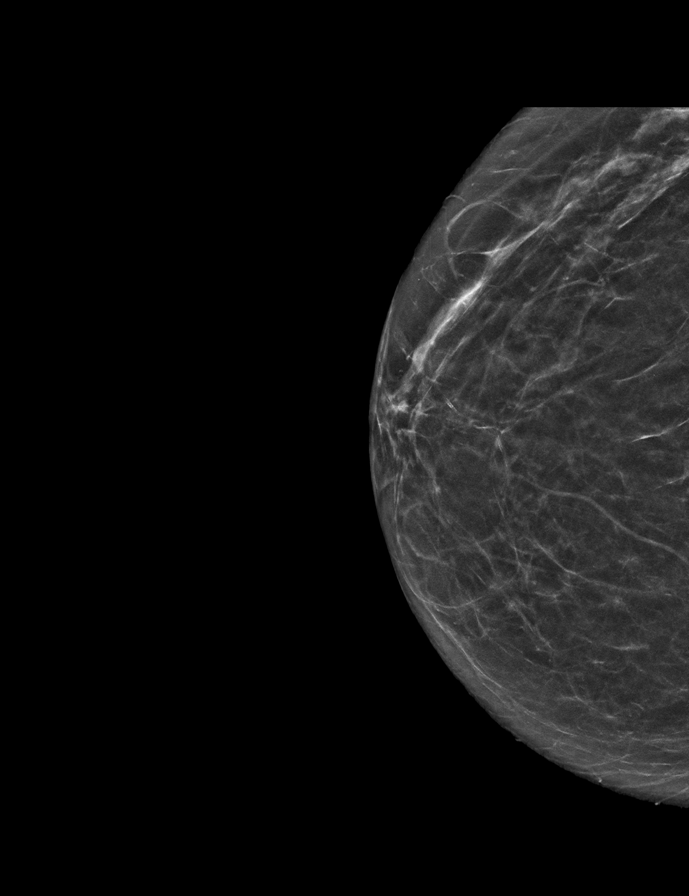

[L MLOID BREAST TOMOSYNTHESIS IMAGE tomo · tomo slice 25/50.0]
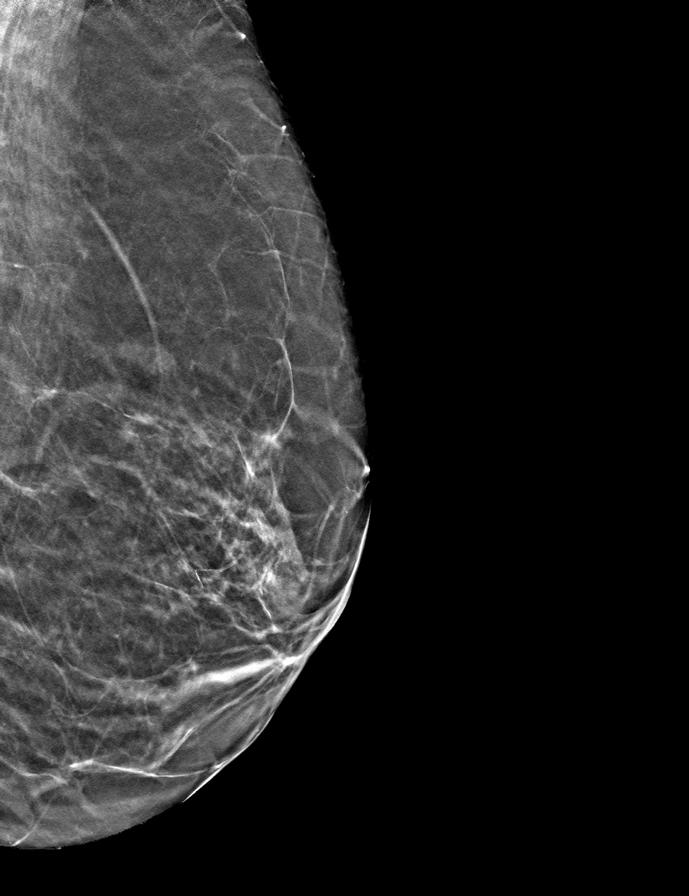

[9 of 28 positions shown; findings below may reference images not displayed]

ACR Breast Density Category b: There are scattered areas of
fibroglandular density.
FINDINGS: The patient has retropectoral implants. There are no findings
suspicious for malignancy.
IMPRESSION: No mammographic evidence of malignancy. A result letter of this
screening mammogram will be mailed directly to the patient.

RECOMMENDATION:
Screening mammogram in one year. (Code:SE-S-JMG)

BI-RADS CATEGORY  1:  Negative.

## 2023-02-20 DIAGNOSIS — L84 Corns and callosities: Secondary | ICD-10-CM | POA: Diagnosis not present

## 2023-02-20 DIAGNOSIS — M2041 Other hammer toe(s) (acquired), right foot: Secondary | ICD-10-CM | POA: Diagnosis not present

## 2023-04-06 DIAGNOSIS — L84 Corns and callosities: Secondary | ICD-10-CM | POA: Diagnosis not present

## 2023-04-06 DIAGNOSIS — M2041 Other hammer toe(s) (acquired), right foot: Secondary | ICD-10-CM | POA: Diagnosis not present

## 2023-04-30 DIAGNOSIS — Z1212 Encounter for screening for malignant neoplasm of rectum: Secondary | ICD-10-CM | POA: Diagnosis not present

## 2023-04-30 DIAGNOSIS — Z1389 Encounter for screening for other disorder: Secondary | ICD-10-CM | POA: Diagnosis not present

## 2023-04-30 DIAGNOSIS — M8580 Other specified disorders of bone density and structure, unspecified site: Secondary | ICD-10-CM | POA: Diagnosis not present

## 2023-04-30 DIAGNOSIS — I10 Essential (primary) hypertension: Secondary | ICD-10-CM | POA: Diagnosis not present

## 2023-05-01 DIAGNOSIS — R82998 Other abnormal findings in urine: Secondary | ICD-10-CM | POA: Diagnosis not present

## 2023-05-11 DIAGNOSIS — M2041 Other hammer toe(s) (acquired), right foot: Secondary | ICD-10-CM | POA: Diagnosis not present

## 2023-05-11 DIAGNOSIS — L84 Corns and callosities: Secondary | ICD-10-CM | POA: Diagnosis not present

## 2023-06-08 DIAGNOSIS — M2041 Other hammer toe(s) (acquired), right foot: Secondary | ICD-10-CM | POA: Diagnosis not present

## 2023-06-08 DIAGNOSIS — L84 Corns and callosities: Secondary | ICD-10-CM | POA: Diagnosis not present

## 2023-07-06 ENCOUNTER — Other Ambulatory Visit: Payer: Self-pay | Admitting: Internal Medicine

## 2023-07-06 DIAGNOSIS — Z1231 Encounter for screening mammogram for malignant neoplasm of breast: Secondary | ICD-10-CM

## 2023-07-09 DIAGNOSIS — L84 Corns and callosities: Secondary | ICD-10-CM | POA: Diagnosis not present

## 2023-07-09 DIAGNOSIS — M2041 Other hammer toe(s) (acquired), right foot: Secondary | ICD-10-CM | POA: Diagnosis not present

## 2023-07-21 DIAGNOSIS — Z01419 Encounter for gynecological examination (general) (routine) without abnormal findings: Secondary | ICD-10-CM | POA: Diagnosis not present

## 2023-07-23 ENCOUNTER — Ambulatory Visit: Payer: PPO

## 2023-07-28 NOTE — Progress Notes (Signed)
 This encounter was created in error - please disregard.

## 2023-07-29 ENCOUNTER — Ambulatory Visit: Payer: PPO

## 2023-08-03 DIAGNOSIS — L700 Acne vulgaris: Secondary | ICD-10-CM | POA: Diagnosis not present

## 2023-08-06 ENCOUNTER — Ambulatory Visit: Payer: PPO

## 2023-08-06 DIAGNOSIS — M48062 Spinal stenosis, lumbar region with neurogenic claudication: Secondary | ICD-10-CM | POA: Diagnosis not present

## 2023-08-12 ENCOUNTER — Ambulatory Visit: Payer: PPO

## 2023-08-18 ENCOUNTER — Ambulatory Visit: Payer: PPO

## 2023-08-18 DIAGNOSIS — L84 Corns and callosities: Secondary | ICD-10-CM | POA: Diagnosis not present

## 2023-08-18 DIAGNOSIS — M2041 Other hammer toe(s) (acquired), right foot: Secondary | ICD-10-CM | POA: Diagnosis not present

## 2023-08-19 DIAGNOSIS — H40013 Open angle with borderline findings, low risk, bilateral: Secondary | ICD-10-CM | POA: Diagnosis not present

## 2023-08-20 ENCOUNTER — Ambulatory Visit: Payer: PPO

## 2023-08-26 ENCOUNTER — Ambulatory Visit: Payer: PPO

## 2023-08-27 ENCOUNTER — Ambulatory Visit: Payer: PPO

## 2023-09-07 ENCOUNTER — Ambulatory Visit
Admission: RE | Admit: 2023-09-07 | Discharge: 2023-09-07 | Disposition: A | Discharge status: Home / Self Care | Source: Ambulatory Visit | Attending: Internal Medicine | Admitting: Internal Medicine

## 2023-09-07 DIAGNOSIS — Z1231 Encounter for screening mammogram for malignant neoplasm of breast: Secondary | ICD-10-CM

## 2024-04-22 ENCOUNTER — Other Ambulatory Visit (HOSPITAL_COMMUNITY): Payer: Self-pay

## 2024-04-25 ENCOUNTER — Other Ambulatory Visit (HOSPITAL_COMMUNITY): Payer: Self-pay
